# Patient Record
Sex: Male | Born: 1970
Health system: Southern US, Community
[De-identification: ages and names within clinical notes are randomized; demographics above are authoritative.]

## PROBLEM LIST (undated history)

## (undated) DIAGNOSIS — Z6828 Body mass index (BMI) 28.0-28.9, adult: Secondary | ICD-10-CM

## (undated) DIAGNOSIS — L409 Psoriasis, unspecified: Secondary | ICD-10-CM

## (undated) HISTORY — PX: LEG SURGERY: SHX1003

## (undated) HISTORY — DX: Psoriasis, unspecified: L40.9

## (undated) HISTORY — PX: SHOULDER SURGERY: SHX246

## (undated) HISTORY — DX: Body mass index (BMI) 28.0-28.9, adult: Z68.28

---

## 1993-05-17 DIAGNOSIS — C801 Malignant (primary) neoplasm, unspecified: Secondary | ICD-10-CM

## 1993-05-17 HISTORY — PX: COLON SURGERY: SHX602

## 1993-05-17 HISTORY — DX: Malignant (primary) neoplasm, unspecified: C80.1

## 1998-05-27 ENCOUNTER — Ambulatory Visit (HOSPITAL_COMMUNITY): Admission: RE | Admit: 1998-05-27 | Discharge: 1998-05-27 | Payer: Self-pay | Admitting: Gastroenterology

## 2000-10-03 ENCOUNTER — Ambulatory Visit (HOSPITAL_COMMUNITY): Admission: RE | Admit: 2000-10-03 | Discharge: 2000-10-03 | Payer: Self-pay | Admitting: Gastroenterology

## 2000-10-03 ENCOUNTER — Encounter (INDEPENDENT_AMBULATORY_CARE_PROVIDER_SITE_OTHER): Payer: Self-pay | Admitting: Specialist

## 2002-04-23 ENCOUNTER — Ambulatory Visit (HOSPITAL_COMMUNITY): Admission: RE | Admit: 2002-04-23 | Discharge: 2002-04-23 | Payer: Self-pay | Admitting: Gastroenterology

## 2005-04-15 ENCOUNTER — Ambulatory Visit: Payer: Self-pay | Admitting: Hematology & Oncology

## 2005-10-09 ENCOUNTER — Ambulatory Visit: Payer: Self-pay | Admitting: Hematology & Oncology

## 2005-10-13 LAB — CBC WITH DIFFERENTIAL/PLATELET
Basophils Absolute: 0 10*3/uL (ref 0.0–0.1)
Eosinophils Absolute: 0.1 10*3/uL (ref 0.0–0.5)
HCT: 43.4 % (ref 38.7–49.9)
HGB: 15.3 g/dL (ref 13.0–17.1)
LYMPH%: 31.3 % (ref 14.0–48.0)
MONO#: 0.4 10*3/uL (ref 0.1–0.9)
NEUT#: 2.2 10*3/uL (ref 1.5–6.5)
Platelets: 226 10*3/uL (ref 145–400)
RBC: 4.84 10*6/uL (ref 4.20–5.71)
WBC: 3.9 10*3/uL — ABNORMAL LOW (ref 4.0–10.0)

## 2005-10-13 LAB — CEA: CEA: 2 ng/mL (ref 0.0–5.0)

## 2005-10-13 LAB — COMPREHENSIVE METABOLIC PANEL
ALT: 29 U/L (ref 0–40)
CO2: 26 mEq/L (ref 19–32)
Calcium: 9.2 mg/dL (ref 8.4–10.5)
Chloride: 104 mEq/L (ref 96–112)
Creatinine, Ser: 1.2 mg/dL (ref 0.4–1.5)
Total Protein: 7.3 g/dL (ref 6.0–8.3)

## 2006-10-07 ENCOUNTER — Ambulatory Visit: Payer: Self-pay | Admitting: Hematology & Oncology

## 2007-09-17 ENCOUNTER — Inpatient Hospital Stay (HOSPITAL_COMMUNITY): Admission: EM | Admit: 2007-09-17 | Discharge: 2007-09-19 | Payer: Self-pay | Admitting: Emergency Medicine

## 2007-12-18 ENCOUNTER — Ambulatory Visit (HOSPITAL_COMMUNITY): Admission: RE | Admit: 2007-12-18 | Discharge: 2007-12-18 | Payer: Self-pay | Admitting: Orthopedic Surgery

## 2008-12-19 ENCOUNTER — Ambulatory Visit: Payer: Self-pay | Admitting: Hematology & Oncology

## 2008-12-23 LAB — CBC WITH DIFFERENTIAL (CANCER CENTER ONLY)
BASO%: 0.9 % (ref 0.0–2.0)
HCT: 44.1 % (ref 38.7–49.9)
LYMPH#: 1.6 10*3/uL (ref 0.9–3.3)
MONO#: 0.3 10*3/uL (ref 0.1–0.9)
Platelets: 190 10*3/uL (ref 145–400)
RDW: 11.1 % (ref 10.5–14.6)
WBC: 3.8 10*3/uL — ABNORMAL LOW (ref 4.0–10.0)

## 2008-12-23 LAB — COMPREHENSIVE METABOLIC PANEL
ALT: 22 U/L (ref 0–53)
AST: 21 U/L (ref 0–37)
Albumin: 4.4 g/dL (ref 3.5–5.2)
CO2: 27 mEq/L (ref 19–32)
Calcium: 9.5 mg/dL (ref 8.4–10.5)
Chloride: 102 mEq/L (ref 96–112)
Potassium: 4.4 mEq/L (ref 3.5–5.3)
Sodium: 137 mEq/L (ref 135–145)
Total Protein: 7.2 g/dL (ref 6.0–8.3)

## 2008-12-23 LAB — LIPID PANEL: Total CHOL/HDL Ratio: 3.4 Ratio

## 2009-01-22 IMAGING — CR DG FOOT COMPLETE 3+V*L*
3 series · 3 of 3 positions shown · non-contrast
Comparison: None

CLINICAL DATA: Fell

LEFT FOOT - COMPLETE 3+ VIEW

[view not recorded (1 of 3)]
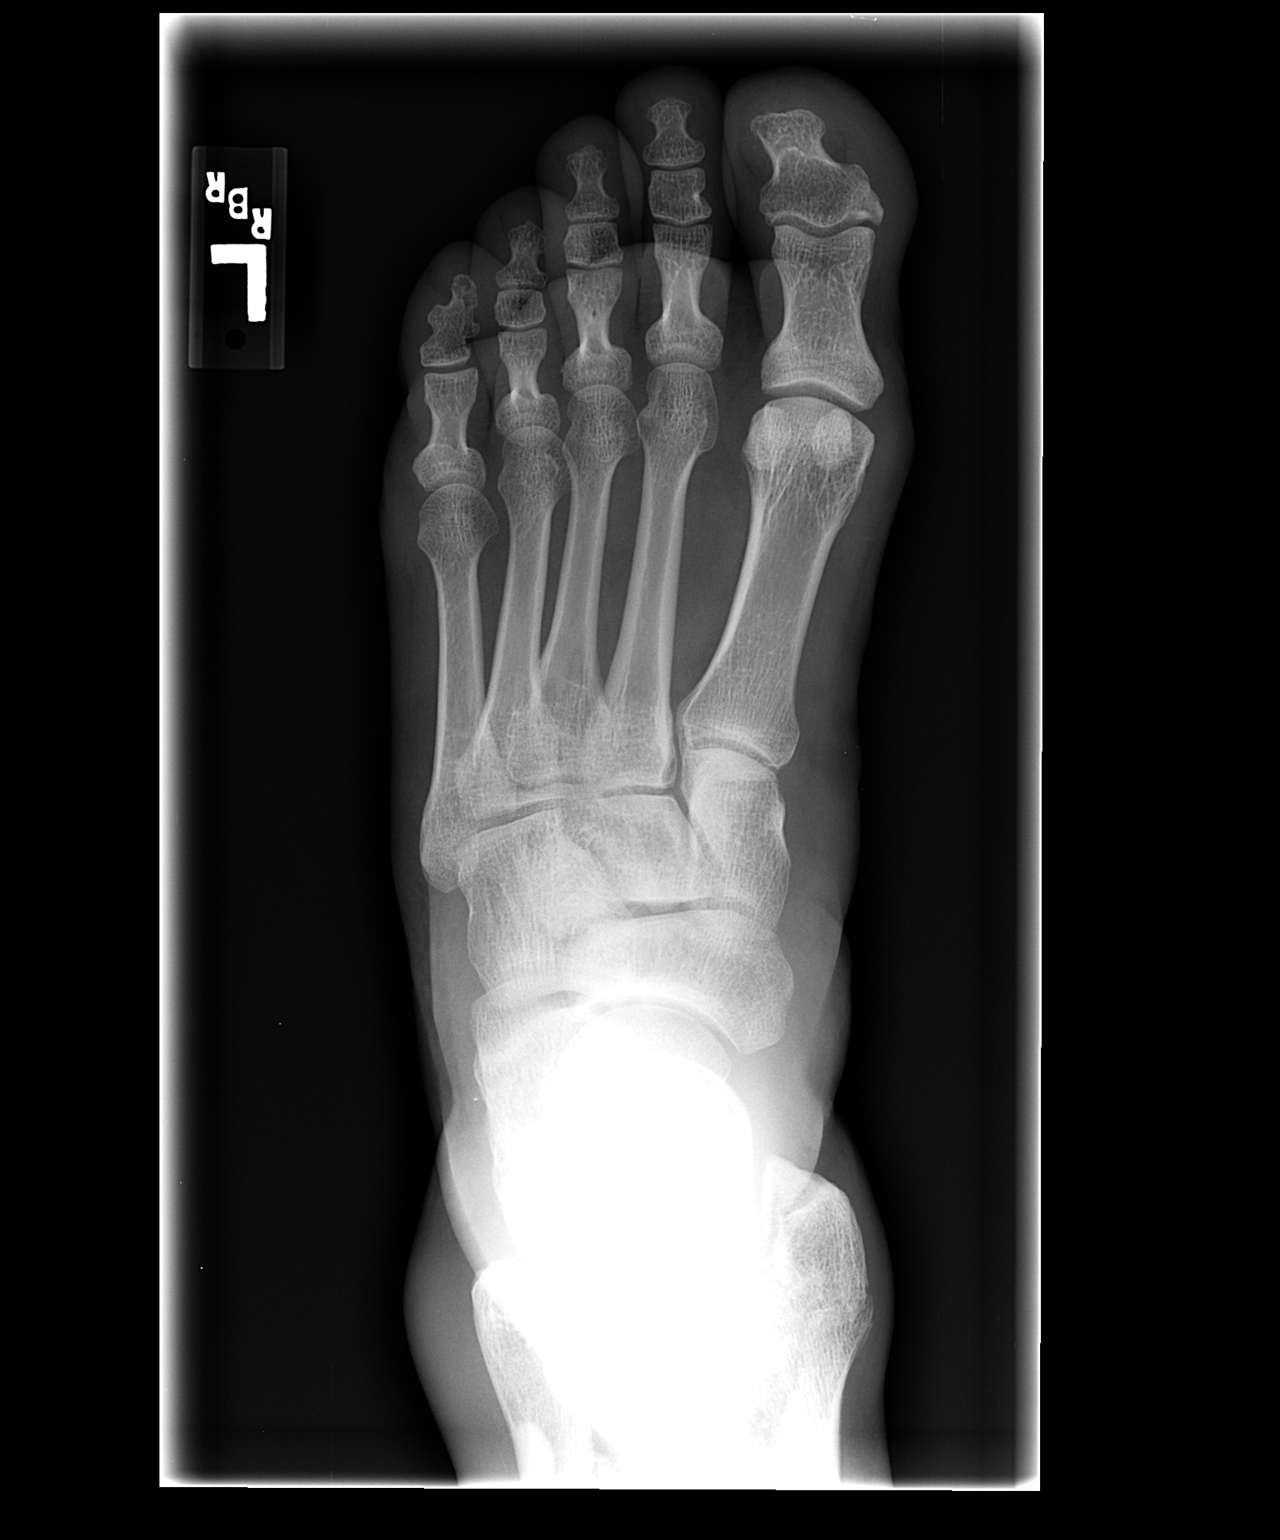

[view not recorded (2 of 3)]
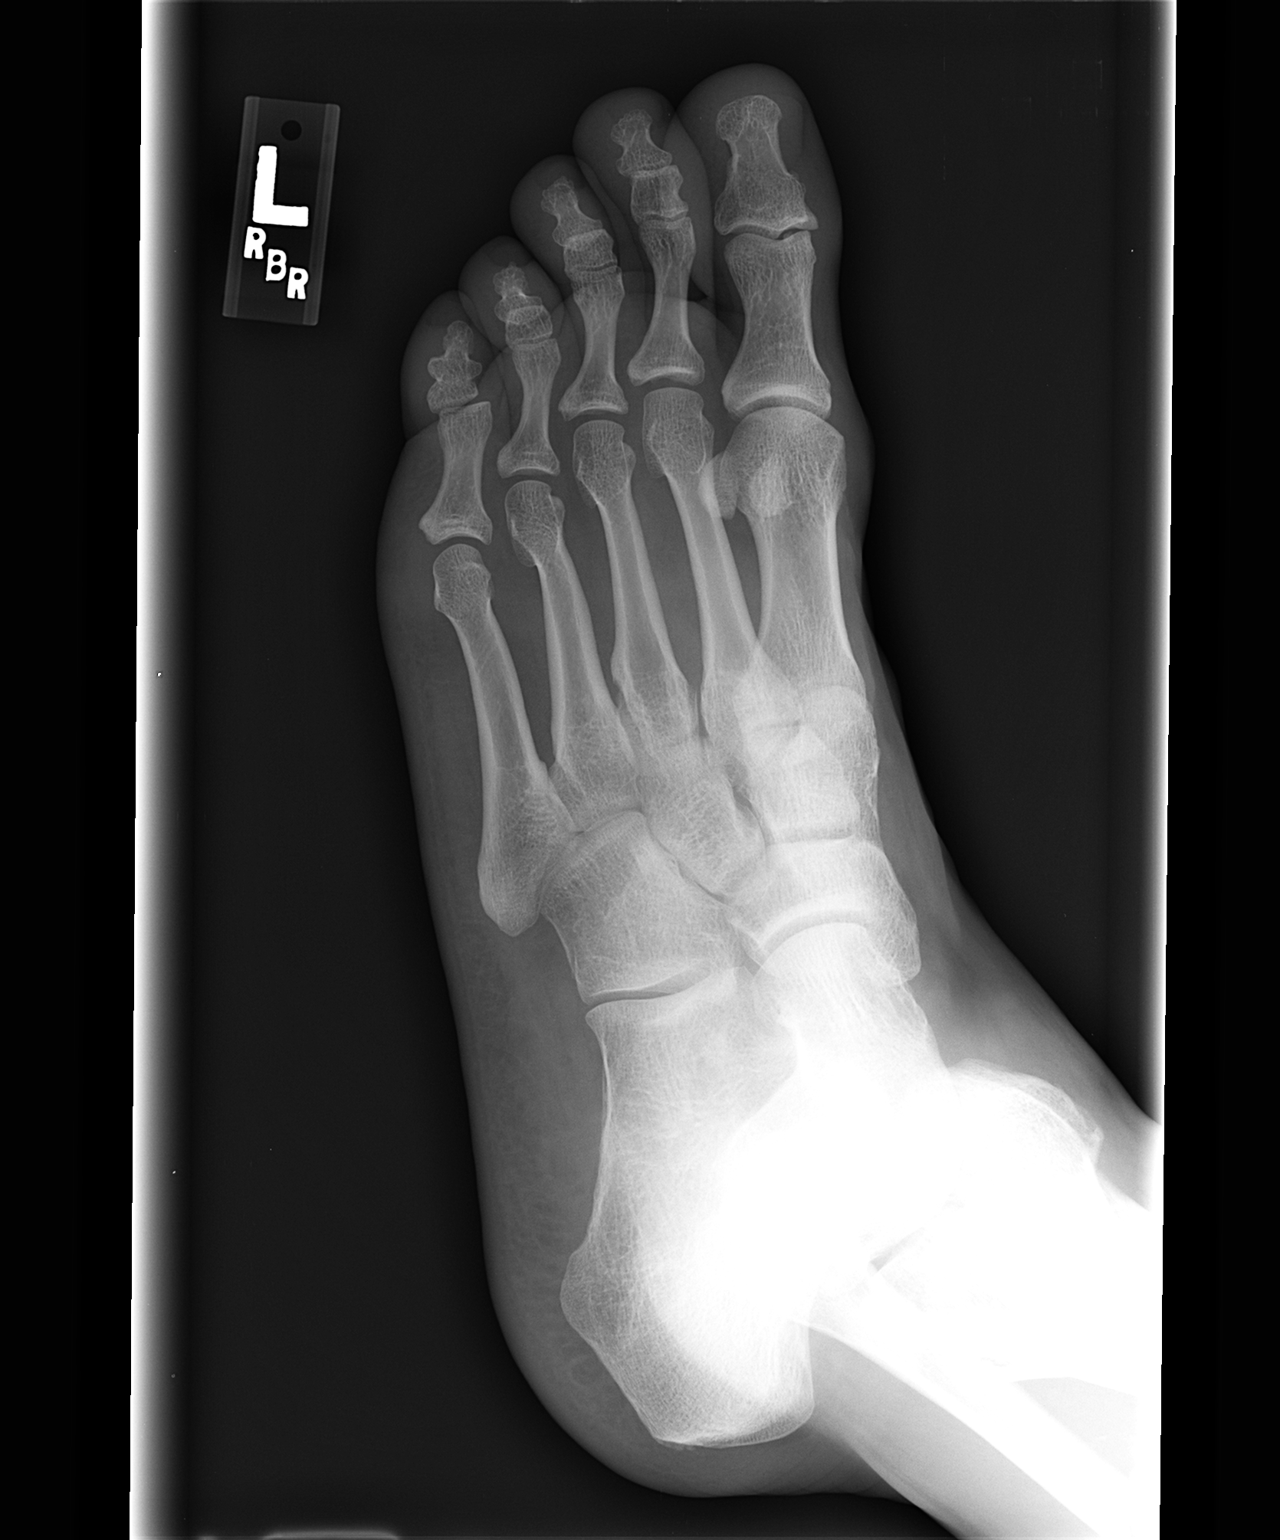

[view not recorded (3 of 3)]
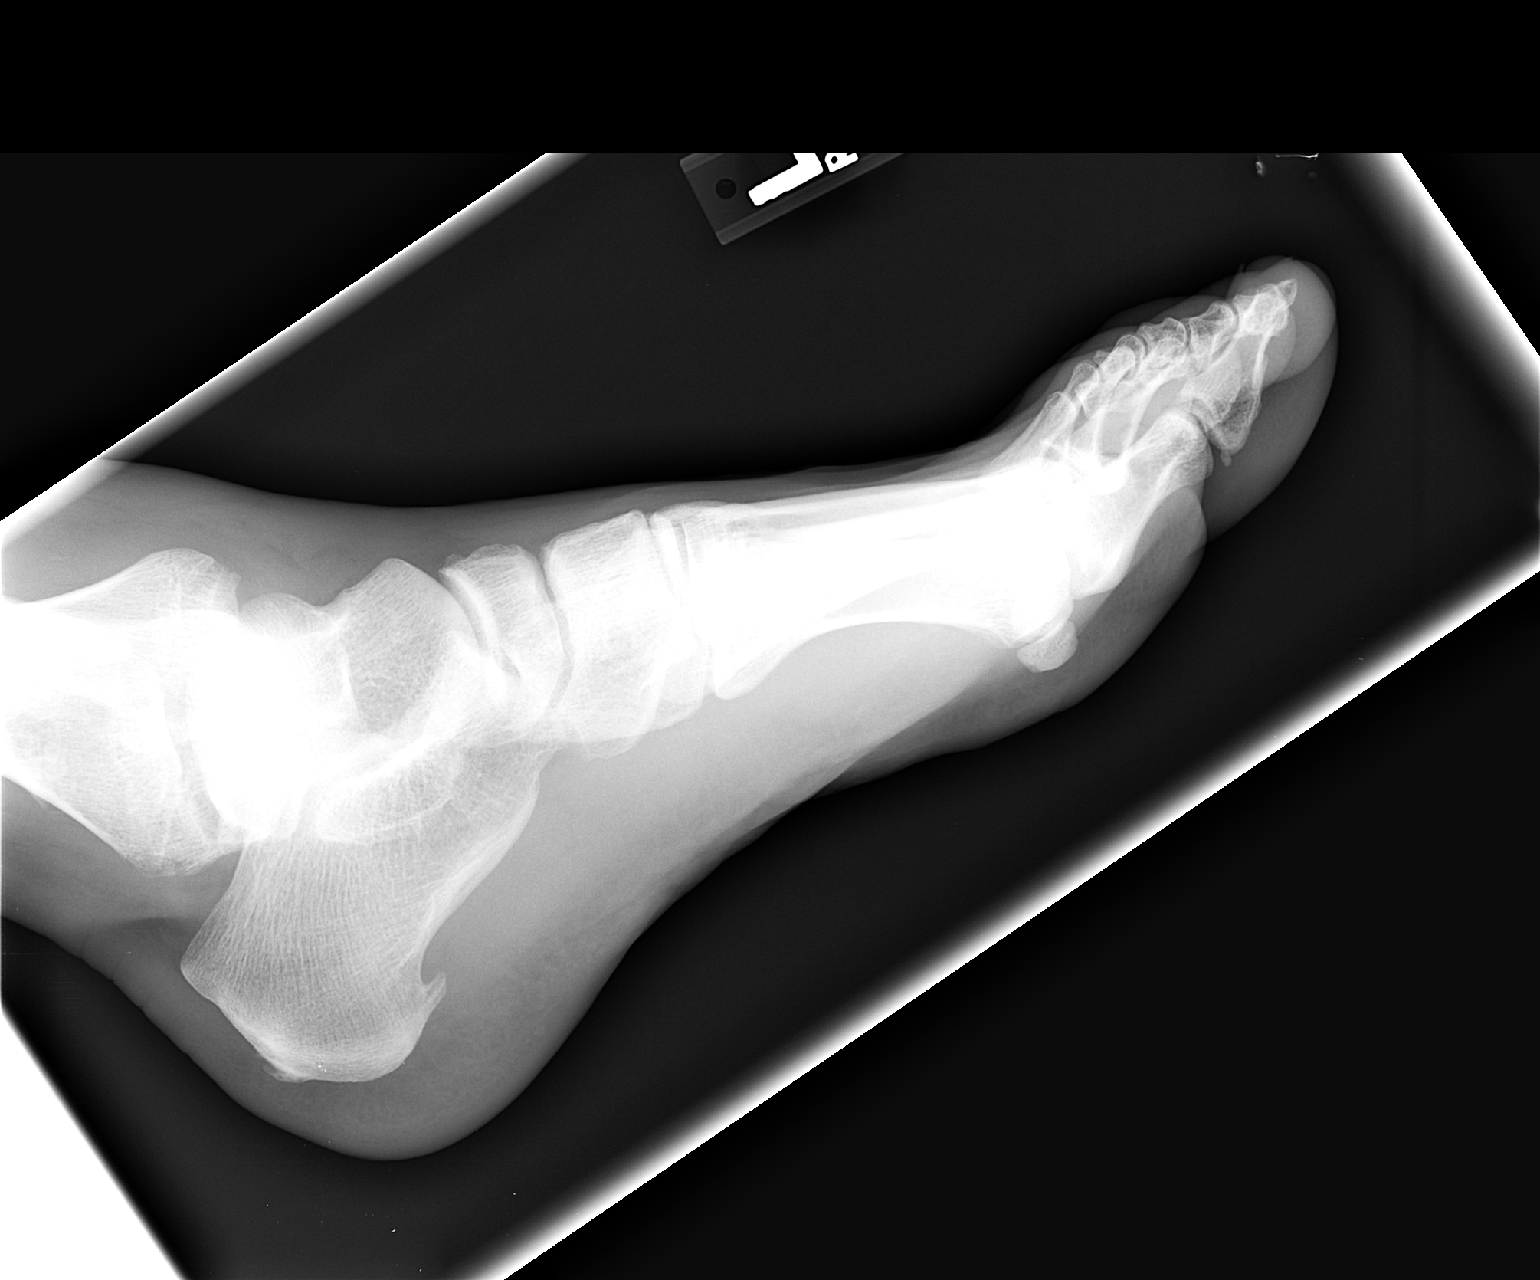

[3 of 3 positions shown; findings below may reference images not displayed]

FINDINGS: The bony structures of the foot are intact.  Joint spaces
are maintained.  The ankle fractures are noted.  Small calcaneal
heel spurs are noted.  There is a benign appearing enostosis
involving the distal fibula.
IMPRESSION: 1.  No foot fractures.
2.  Ankle fractures are again demonstrated.

## 2009-06-24 ENCOUNTER — Ambulatory Visit: Payer: Self-pay | Admitting: Hematology & Oncology

## 2009-07-02 LAB — COMPREHENSIVE METABOLIC PANEL
ALT: 23 U/L (ref 0–53)
Albumin: 4.6 g/dL (ref 3.5–5.2)
Alkaline Phosphatase: 47 U/L (ref 39–117)
Potassium: 4.5 mEq/L (ref 3.5–5.3)
Sodium: 140 mEq/L (ref 135–145)
Total Bilirubin: 0.7 mg/dL (ref 0.3–1.2)
Total Protein: 7.3 g/dL (ref 6.0–8.3)

## 2009-07-02 LAB — CBC WITH DIFFERENTIAL (CANCER CENTER ONLY)
BASO%: 0.4 % (ref 0.0–2.0)
Eosinophils Absolute: 0.1 10*3/uL (ref 0.0–0.5)
LYMPH%: 47.1 % (ref 14.0–48.0)
MCV: 92 fL (ref 82–98)
MONO#: 0.2 10*3/uL (ref 0.1–0.9)
MONO%: 6.7 % (ref 0.0–13.0)
NEUT#: 1.4 10*3/uL — ABNORMAL LOW (ref 1.5–6.5)
Platelets: 199 10*3/uL (ref 145–400)
RBC: 4.95 10*6/uL (ref 4.20–5.70)
RDW: 11.1 % (ref 10.5–14.6)
WBC: 3.2 10*3/uL — ABNORMAL LOW (ref 4.0–10.0)

## 2009-07-02 LAB — LIPID PANEL
LDL Cholesterol: 108 mg/dL — ABNORMAL HIGH (ref 0–99)
VLDL: 31 mg/dL (ref 0–40)

## 2009-12-29 ENCOUNTER — Ambulatory Visit: Payer: Self-pay | Admitting: Hematology & Oncology

## 2010-09-29 NOTE — Op Note (Signed)
NAME:  Mario Bradley, Mario Bradley            ACCOUNT NO.:  [id number]   MEDICAL RECORD NO.:  [id number]          PATIENT TYPE:  INP   LOCATION:  1831                         FACILITY:  [facility]   PHYSICIAN:  Marlowe Kays, M.D.  DATE OF BIRTH:  February 10, 1971   DATE OF PROCEDURE:  09/17/2007  DATE OF DISCHARGE:                               OPERATIVE REPORT   PREOPERATIVE DIAGNOSIS:  Closed displaced distal tibia and fibular  fractures, left ankle.   POSTOPERATIVE DIAGNOSIS:  Pilon-type fracture, distal left ankle.   OPERATION:  ORIF of pilon-type fracture, left ankle utilizing a 6-hole  fibular plate, 2 anterior-to-posterior screws in distal tibia to restore  the ankle joint, and a syndesmosis screw to restore the mortise.   SURGEON:  Marlowe Kays, MD   ASSISTANT:  Jamelle Rushing, [facility]   ANESTHESIA:  General.   JUSTIFICATION FOR PROCEDURE:  He had a vertical loading force type  injury with a fracture of the distal tibial diaphysis with shortening  and a displaced fracture of distal tibia which on attempted closed  reduction using a C-arm turned out to be very comminuted distal tibial  fracture with a split down the middle of the ankle joint on the sagittal  projection.   PROCEDURE IN DETAIL:  Prophylactic antibiotic, satisfactory general  anesthesia, time-out performed, pneumatic tourniquet applied, and the  left leg Esmarched out nonsterilely.  After the attempted closed  reduction which also allowed [place] to have a better visualization of the  fracture was performed, we then prepped the left leg with DuraPrep from  knee to toes and draped the sterile field.  Using a C-arm, I first  located the site of the distal fibular fracture and made incision there  protecting the superficial peroneal nerve and went down to the fracture  site which we anatomically reduced and then held while I placed a 6-hole  semitubular plate.  This reachieved the fibular length and helped  further at the reduction  of the ankle joint.  Despite manipulation of  the ankle joint at this point, we were unable to get a good reduction  and consequently I made an anterior midline incision and protecting the  neurovascular structures went down to the tibia and placed 2 cannulated  screw guide pins in satisfactory position on both AP and lateral  projections.  I then overdrilled these and measured them using a 4-0  cancellous type screw distally and found that I needed a fully threaded  4.5 cortical screw on the proximal hole to really bring down the ankle  joint and restore the plafond on the lateral projection.  There was also  a little widening of the medial mortise and through this site made a  separate lateral incision using the C-arm for guidance and was able to  thread a drill bit between the 2 anterior screws and followed this with  a 4.5 cortical screw restoring the ankle mortise.  The badly comminuted  tibial fracture included the medial malleolus and the metaphyseal and  diaphyseal areas but everything was in good position and seemed  reasonably stable and I saw no need for  any internal fixation which  might even have been contraindicated because of the comminution.  The  wound was then well irrigated with sterile saline.  The anterior ankle  wound was closed with 2-0 Vicryl in subcutaneous tissue and interrupted  3-0 nylon mattress sutures in the skin.  The same was true for the  distal fibular incision and more proximally the muscle and fascia were  closed with interrupted 0 Vicryl, 2-0 Vicryl superficially, and again 3-  0 nylon on the skin.  Dry sterile dressings were  applied.  Tourniquet was released.  The foot pinked up nicely.  A well-  padded short-leg splint cast was applied.  He tolerated the procedure  well and was taken to the recovery room in satisfactory condition with  no known complications.           ______________________________  Marlowe Kays, M.D.     JA/MEDQ  D:   09/17/2007  T:  09/18/2007  Job:  [id number]

## 2010-09-29 NOTE — Op Note (Signed)
NAME:  Mario Bradley, Mario Bradley            ACCOUNT NO.:  [id number]   MEDICAL RECORD NO.:  [id number]          PATIENT TYPE:  AMB   LOCATION:  DAY                          FACILITY:  [facility]   PHYSICIAN:  Marlowe Kays, M.D.  DATE OF BIRTH:  Jun 04, 1970   DATE OF PROCEDURE:  12/18/2007  DATE OF DISCHARGE:                               OPERATIVE REPORT   PREOPERATIVE DIAGNOSIS:  Retained syndesmosis screw, left ankle, status  post open reduction and internal fixation of complex pilon fracture.   POSTOPERATIVE DIAGNOSIS:  Retained syndesmosis screw, left ankle, status  post open reduction and internal fixation of complex pilon fracture.   OPERATION:  Removal of syndesmosis screw, lateral left ankle.   SURGEON:  Marlowe Kays, M.D.   ASSISTANT:  Nurse.   ANESTHESIA:  General.   PATHOLOGY AND JUSTIFICATION FOR PROCEDURE:  Roughly 13 weeks ago we had  performed ORIF of the complex pilon fracture and part of this was the  placement of a syndesmosis screw.  He has been careful not to weightbear  and he is here at this time for removal of the syndesmosis screw to  begin weightbearing.   PROCEDURE:  Satisfactory general anesthesia, pneumatic tourniquet, left  lower extremity.  The leg was esmarched out nonsterilely and tourniquet  inflated to 300 mmHg, the left foot and ankle prepped with DuraPrep,  draped in a sterile field.  Time-out performed.  Using the mini C-arm, I  was able localize the screw at the appropriate level of the distal  fibula.  I made my incision through the old surgical incision and was  not immediately able to find the screw.  I then used the C-arm to look  for the position of the screw on the lateral plane and found it  inferiorly.  The screw was found, removed, and it was saved for the  patient.  The wound was irrigated with sterile saline and the  subcutaneous tissues all infiltrated with 0.5% plain Marcaine.  I then  closed the wound with interrupted 2-0 Vicryl in  the fascial-type layer  and subcutaneous layers and interrupted 4-0 nylon mattress sutures in  the skin.  Betadine and Adaptic dry sterile dressing were applied.  He  tolerated the procedure well and was on his way to recovery in  satisfactory condition at this time with no known complications.           ______________________________  Marlowe Kays, M.D.     JA/MEDQ  D:  12/18/2007  T:  12/18/2007  Job:  [id number]

## 2010-10-02 NOTE — Procedures (Signed)
[place]  Patient:    Mario Bradley, Mario Bradley                   MRN: [id number] Proc. Date: 10/03/00 Adm. Date:  [id number] Attending:  Rich Brave CC:         Rose Phi. Myna Hidalgo, M.D.  Delaney Meigs, M.D.   Procedure Report  PROCEDURE PERFORMED:  Colonoscopy with biopsies.  INDICATION:  A 40 year old who is now about six years status post resection of a proximal colonic cancer.  He has done well in the interim.  His most surveillance examination, two years ago, was negative.  FINDINGS:  Small ulcerations right at the ileocolonic anastomosis without involvement of the more proximal portions of the terminal ileum, probable related to aspirin exposure.  Diminutive early polyp at 20 cm biopsied.  DESCRIPTION OF PROCEDURE:  The nature, purpose and risks of the procedure were familiar to the patient, who provided written consent.  Sedation was Fentanyl 50 mcg and Versed 7 mg IV without arrhythmias or desaturation.  Digital exam of the prostate was normal.  The Olympus adult video colonoscope was easily advanced to the ileocolonic anastomosis and for a moderate distance into a normal appearing terminal ileum.  However, right at the ileocolonic anastomosis there were several small (3 to 5 mm) irregularly bordered, slightly deep ulcerations.  Ulcerations were not observed elsewhere, either in the TI or in the colon.  There was a diminutive early polyp at 20 cm removed by a single cold biopsy.  No large polyps or evidence of recurrent cancer were observed.  There was no evidence of colitis, vascular malformations or diverticulosis.  Retroflexion could not be accomplished in the rectum due to the small rectal ampulla.  The patient tolerated this procedure well and there were no apparent complications.  IMPRESSION:  Findings as described above.  Small polyp and focal ulcerations which I suspect are due to aspirin exposure.  PLAN:   Await pathology. DD:  10/03/00 TD:  10/03/00 Job: [id number]

## 2011-02-10 LAB — CBC
HCT: 46
MCV: 92.7
RBC: 4.96
WBC: 9.7

## 2011-02-10 LAB — BASIC METABOLIC PANEL
Chloride: 102
GFR calc Af Amer: >60
Potassium: 3.9
Sodium: 139

## 2011-02-10 LAB — HEPATIC FUNCTION PANEL
ALT: 36
Albumin: 4
Alkaline Phosphatase: 44
Total Protein: 7.6

## 2011-02-10 LAB — PROTIME-INR: Prothrombin Time: 14.1

## 2014-10-18 ENCOUNTER — Other Ambulatory Visit: Payer: Self-pay | Admitting: Physician Assistant

## 2019-05-30 DIAGNOSIS — L409 Psoriasis, unspecified: Secondary | ICD-10-CM | POA: Diagnosis not present

## 2019-08-13 DIAGNOSIS — Z1159 Encounter for screening for other viral diseases: Secondary | ICD-10-CM | POA: Diagnosis not present

## 2019-08-16 DIAGNOSIS — Z980 Intestinal bypass and anastomosis status: Secondary | ICD-10-CM | POA: Diagnosis not present

## 2019-08-16 DIAGNOSIS — Z85038 Personal history of other malignant neoplasm of large intestine: Secondary | ICD-10-CM | POA: Diagnosis not present

## 2019-08-16 DIAGNOSIS — D123 Benign neoplasm of transverse colon: Secondary | ICD-10-CM | POA: Diagnosis not present

## 2019-10-03 DIAGNOSIS — M9905 Segmental and somatic dysfunction of pelvic region: Secondary | ICD-10-CM | POA: Diagnosis not present

## 2019-10-03 DIAGNOSIS — M62830 Muscle spasm of back: Secondary | ICD-10-CM | POA: Diagnosis not present

## 2019-10-03 DIAGNOSIS — M9903 Segmental and somatic dysfunction of lumbar region: Secondary | ICD-10-CM | POA: Diagnosis not present

## 2019-10-03 DIAGNOSIS — M9904 Segmental and somatic dysfunction of sacral region: Secondary | ICD-10-CM | POA: Diagnosis not present

## 2019-10-04 DIAGNOSIS — M9904 Segmental and somatic dysfunction of sacral region: Secondary | ICD-10-CM | POA: Diagnosis not present

## 2019-10-04 DIAGNOSIS — M9903 Segmental and somatic dysfunction of lumbar region: Secondary | ICD-10-CM | POA: Diagnosis not present

## 2019-10-04 DIAGNOSIS — M9905 Segmental and somatic dysfunction of pelvic region: Secondary | ICD-10-CM | POA: Diagnosis not present

## 2019-10-04 DIAGNOSIS — M62830 Muscle spasm of back: Secondary | ICD-10-CM | POA: Diagnosis not present

## 2019-10-08 DIAGNOSIS — M9905 Segmental and somatic dysfunction of pelvic region: Secondary | ICD-10-CM | POA: Diagnosis not present

## 2019-10-08 DIAGNOSIS — M9903 Segmental and somatic dysfunction of lumbar region: Secondary | ICD-10-CM | POA: Diagnosis not present

## 2019-10-08 DIAGNOSIS — M9904 Segmental and somatic dysfunction of sacral region: Secondary | ICD-10-CM | POA: Diagnosis not present

## 2019-10-08 DIAGNOSIS — M62830 Muscle spasm of back: Secondary | ICD-10-CM | POA: Diagnosis not present

## 2019-10-11 DIAGNOSIS — M9903 Segmental and somatic dysfunction of lumbar region: Secondary | ICD-10-CM | POA: Diagnosis not present

## 2019-10-11 DIAGNOSIS — M9905 Segmental and somatic dysfunction of pelvic region: Secondary | ICD-10-CM | POA: Diagnosis not present

## 2019-10-11 DIAGNOSIS — M62830 Muscle spasm of back: Secondary | ICD-10-CM | POA: Diagnosis not present

## 2019-10-11 DIAGNOSIS — M9904 Segmental and somatic dysfunction of sacral region: Secondary | ICD-10-CM | POA: Diagnosis not present

## 2019-10-18 DIAGNOSIS — M9904 Segmental and somatic dysfunction of sacral region: Secondary | ICD-10-CM | POA: Diagnosis not present

## 2019-10-18 DIAGNOSIS — M9903 Segmental and somatic dysfunction of lumbar region: Secondary | ICD-10-CM | POA: Diagnosis not present

## 2019-10-18 DIAGNOSIS — M9905 Segmental and somatic dysfunction of pelvic region: Secondary | ICD-10-CM | POA: Diagnosis not present

## 2019-10-18 DIAGNOSIS — M62830 Muscle spasm of back: Secondary | ICD-10-CM | POA: Diagnosis not present

## 2019-10-30 DIAGNOSIS — M62830 Muscle spasm of back: Secondary | ICD-10-CM | POA: Diagnosis not present

## 2019-10-30 DIAGNOSIS — M9903 Segmental and somatic dysfunction of lumbar region: Secondary | ICD-10-CM | POA: Diagnosis not present

## 2019-10-30 DIAGNOSIS — M9905 Segmental and somatic dysfunction of pelvic region: Secondary | ICD-10-CM | POA: Diagnosis not present

## 2019-10-30 DIAGNOSIS — M9904 Segmental and somatic dysfunction of sacral region: Secondary | ICD-10-CM | POA: Diagnosis not present

## 2019-11-06 DIAGNOSIS — M62830 Muscle spasm of back: Secondary | ICD-10-CM | POA: Diagnosis not present

## 2019-11-06 DIAGNOSIS — M9903 Segmental and somatic dysfunction of lumbar region: Secondary | ICD-10-CM | POA: Diagnosis not present

## 2019-11-06 DIAGNOSIS — M9905 Segmental and somatic dysfunction of pelvic region: Secondary | ICD-10-CM | POA: Diagnosis not present

## 2019-11-06 DIAGNOSIS — M9904 Segmental and somatic dysfunction of sacral region: Secondary | ICD-10-CM | POA: Diagnosis not present

## 2019-11-20 DIAGNOSIS — M62830 Muscle spasm of back: Secondary | ICD-10-CM | POA: Diagnosis not present

## 2019-11-20 DIAGNOSIS — M9903 Segmental and somatic dysfunction of lumbar region: Secondary | ICD-10-CM | POA: Diagnosis not present

## 2019-11-20 DIAGNOSIS — M9904 Segmental and somatic dysfunction of sacral region: Secondary | ICD-10-CM | POA: Diagnosis not present

## 2019-11-20 DIAGNOSIS — M9905 Segmental and somatic dysfunction of pelvic region: Secondary | ICD-10-CM | POA: Diagnosis not present

## 2020-01-07 ENCOUNTER — Telehealth: Payer: Self-pay | Admitting: Physician Assistant

## 2020-01-07 NOTE — Telephone Encounter (Signed)
Patient calling because [facility] is still unable to fill prescription for [facility]  because our office needs to call [employer] for PA and "PLA" also wants to know if he can have sample for this week.

## 2020-01-07 NOTE — Telephone Encounter (Signed)
Phone call from patient saying that [facility] needed a PA for his stelara before they could ship it. I spoke to [facility] and they told me that all they need is for patient to call and confirm the shipping. I told the patient that he will call and do that. He will call back with any issues.

## 2020-05-02 ENCOUNTER — Encounter: Payer: Self-pay | Admitting: Physician Assistant

## 2020-05-02 ENCOUNTER — Other Ambulatory Visit: Payer: Self-pay | Admitting: Physician Assistant

## 2020-05-02 ENCOUNTER — Telehealth: Payer: Self-pay | Admitting: Physician Assistant

## 2020-05-02 DIAGNOSIS — U071 COVID-19: Secondary | ICD-10-CM

## 2020-05-02 DIAGNOSIS — Z6828 Body mass index (BMI) 28.0-28.9, adult: Secondary | ICD-10-CM

## 2020-05-02 NOTE — Progress Notes (Signed)
I connected by phone with Mario Bradley on 05/02/2020 at 9:48 AM to discuss the potential use of a new treatment for mild to moderate COVID-19 viral infection in non-hospitalized patients.  This patient is a 49 y.o. male that meets the FDA criteria for Emergency Use Authorization of COVID monoclonal antibody sotrovimab, casirivimab/imdevimab or bamlamivimab/estevimab.  Has a (+) direct SARS-CoV-2 viral test result  Has mild or moderate COVID-19   Is NOT hospitalized due to COVID-19  Is within 10 days of symptom onset  Has at least one of the high risk factor(s) for progression to severe COVID-19 and/or hospitalization as defined in EUA.  Specific high risk criteria : BMI > 25   I have spoken and communicated the following to the patient or parent/caregiver regarding COVID monoclonal antibody treatment:  1. FDA has authorized the emergency use for the treatment of mild to moderate COVID-19 in adults and pediatric patients with positive results of direct SARS-CoV-2 viral testing who are 46 years of age and older weighing at least 40 kg, and who are at high risk for progressing to severe COVID-19 and/or hospitalization.  2. The significant known and potential risks and benefits of COVID monoclonal antibody, and the extent to which such potential risks and benefits are unknown.  3. Information on available alternative treatments and the risks and benefits of those alternatives, including clinical trials.  4. Patients treated with COVID monoclonal antibody should continue to self-isolate and use infection control measures (e.g., wear mask, isolate, social distance, avoid sharing personal items, clean and disinfect "high touch" surfaces, and frequent handwashing) according to CDC guidelines.   5. The patient or parent/caregiver has the option to accept or refuse COVID monoclonal antibody treatment.  After reviewing this information with the patient, the patient has agreed to receive one  of the available covid 19 monoclonal antibodies and will be provided an appropriate fact sheet prior to infusion.  Sx onset 12/9. Set up for infusion on 12/18@ 10:30am. Directions given to [facility]. Pt is aware that insurance will be charged an infusion fee. Pt is unvaccinated. + home test that he threw away.   Angelena Form 05/02/2020 9:48 AM

## 2020-05-02 NOTE — Telephone Encounter (Signed)
Called to discuss with patient about Covid symptoms and the use of sotrovimab, bamlanivimab/etesevimab or casirivimab/imdevimab, a monoclonal antibody infusion for those with mild to moderate Covid symptoms and at a high risk of hospitalization.  Pt is qualified for this infusion at the [place] infusion center due to; Specific high risk criteria : needs to be determined.    Message left to call back our hotline [phone]. My chart message sent if active on Mychart.   Angelena Form PA-C

## 2020-05-03 ENCOUNTER — Ambulatory Visit (HOSPITAL_COMMUNITY)
Admission: RE | Admit: 2020-05-03 | Discharge: 2020-05-03 | Disposition: A | Discharge status: Home / Self Care | Payer: BC Managed Care – PPO | Source: Ambulatory Visit | Attending: Pulmonary Disease | Admitting: Pulmonary Disease

## 2020-05-03 DIAGNOSIS — Z6828 Body mass index (BMI) 28.0-28.9, adult: Secondary | ICD-10-CM | POA: Diagnosis not present

## 2020-05-03 DIAGNOSIS — U071 COVID-19: Secondary | ICD-10-CM | POA: Diagnosis not present

## 2020-05-03 MED ORDER — ALBUTEROL SULFATE HFA 108 (90 BASE) MCG/ACT IN AERS: 2.0000 | INHALATION_SPRAY | Freq: Once | RESPIRATORY_TRACT | Status: DC | PRN

## 2020-05-03 MED ORDER — SODIUM CHLORIDE 0.9 % IV SOLN: INTRAVENOUS | Status: DC | PRN

## 2020-05-03 MED ORDER — FAMOTIDINE IN NACL 20-0.9 MG/50ML-% IV SOLN: 20.0000 mg | Freq: Once | INTRAVENOUS | Status: DC | PRN

## 2020-05-03 MED ORDER — EPINEPHRINE 0.3 MG/0.3ML IJ SOAJ: 0.3000 mg | Freq: Once | INTRAMUSCULAR | Status: DC | PRN

## 2020-05-03 MED ORDER — SODIUM CHLORIDE 0.9 % IV SOLN: Freq: Once | INTRAVENOUS | Status: AC

## 2020-05-03 MED ORDER — DIPHENHYDRAMINE HCL 50 MG/ML IJ SOLN: 50.0000 mg | Freq: Once | INTRAMUSCULAR | Status: DC | PRN

## 2020-05-03 MED ORDER — METHYLPREDNISOLONE SODIUM SUCC 125 MG IJ SOLR: 125.0000 mg | Freq: Once | INTRAMUSCULAR | Status: DC | PRN

## 2020-05-03 NOTE — Discharge Instructions (Signed)
10 Things You Can Do to Manage Your COVID-19 Symptoms at Home If you have possible or confirmed COVID-19: 1. Stay home from work and school. And stay away from other public places. If you must go out, avoid using any kind of public transportation, ridesharing, or taxis. 2. Monitor your symptoms carefully. If your symptoms get worse, call your healthcare provider immediately. 3. Get rest and stay hydrated. 4. If you have a medical appointment, call the healthcare provider ahead of time and tell them that you have or may have COVID-19. 5. For medical emergencies, call 911 and notify the dispatch personnel that you have or may have COVID-19. 6. Cover your cough and sneezes with a tissue or use the inside of your elbow. 7. Wash your hands often with soap and water for at least 20 seconds or clean your hands with an alcohol-based hand sanitizer that contains at least 60% alcohol. 8. As much as possible, stay in a specific room and away from other people in your home. Also, you should use a separate bathroom, if available. If you need to be around other people in or outside of the home, wear a mask. 9. Avoid sharing personal items with other people in your household, like dishes, towels, and bedding. 10. Clean all surfaces that are touched often, like counters, tabletops, and doorknobs. Use household cleaning sprays or wipes according to the label instructions. cdc.gov/coronavirus 11/15/2018 This information is not intended to replace advice given to you by your health care provider. Make sure you discuss any questions you have with your health care provider. Document Revised: 04/19/2019 Document Reviewed: 04/19/2019 Elsevier Patient Education  2020 Elsevier Inc. What types of side effects do monoclonal antibody drugs cause?  Common side effects  In general, the more common side effects caused by monoclonal antibody drugs include: . Allergic reactions, such as hives or itching . Flu-like signs and  symptoms, including chills, fatigue, fever, and muscle aches and pains . Nausea, vomiting . Diarrhea . Skin rashes . Low blood pressure   The CDC is recommending patients who receive monoclonal antibody treatments wait at least 90 days before being vaccinated.  Currently, there are no data on the safety and efficacy of mRNA COVID-19 vaccines in persons who received monoclonal antibodies or convalescent plasma as part of COVID-19 treatment. Based on the estimated half-life of such therapies as well as evidence suggesting that reinfection is uncommon in the 90 days after initial infection, vaccination should be deferred for at least 90 days, as a precautionary measure until additional information becomes available, to avoid interference of the antibody treatment with vaccine-induced immune responses. If you have any questions or concerns after the infusion please call the Advanced Practice Provider on call at [phone]. This number is ONLY intended for your use regarding questions or concerns about the infusion post-treatment side-effects.  Please do not provide this number to others for use. For return to work notes please contact your primary care provider.   If someone you know is interested in receiving treatment please have them call the COVID hotline at [phone].   

## 2020-05-03 NOTE — Progress Notes (Signed)
Patient reviewed Fact Sheet for Patients, Parents, and Caregivers for Emergency Use Authorization (EUA) of bamlanivimab and etesevimab for the Treatment of Coronavirus. Patient also reviewed and is agreeable to the estimated cost of treatment. Patient is agreeable to proceed.   

## 2020-05-03 NOTE — Progress Notes (Signed)
  Diagnosis: COVID-19  Physician: Dr. Asencion Noble  Procedure: [facility] Infusion [facility] Med: bamlanivimab\etesevimab infusion - Provided patient with bamlanimivab\etesevimab fact sheet for patients, parents and caregivers prior to infusion.  Complications: No immediate complications noted.  Discharge: Discharged home   Mario Bradley 05/03/2020

## 2020-07-02 ENCOUNTER — Telehealth: Payer: Self-pay

## 2020-07-02 NOTE — Telephone Encounter (Signed)
PATIENT OVER DUE FOR OV & TB FOR REFILLS

## 2020-07-14 ENCOUNTER — Telehealth: Payer: Self-pay | Admitting: Dermatology

## 2020-07-14 NOTE — Telephone Encounter (Signed)
Phone call to patient to inform him that he needs an appointment and a TB Gold before we can refill his medication.  Patient scheduled an appointment.

## 2020-07-14 NOTE — Telephone Encounter (Signed)
Patient gave his insurance information for [employer] and it was put into the system.

## 2020-07-17 ENCOUNTER — Encounter: Payer: Self-pay | Admitting: Dermatology

## 2020-07-17 ENCOUNTER — Ambulatory Visit: Payer: BC Managed Care – PPO | Admitting: Dermatology

## 2020-07-17 ENCOUNTER — Other Ambulatory Visit: Payer: Self-pay

## 2020-07-17 DIAGNOSIS — Z79899 Other long term (current) drug therapy: Secondary | ICD-10-CM | POA: Diagnosis not present

## 2020-07-17 DIAGNOSIS — L400 Psoriasis vulgaris: Secondary | ICD-10-CM | POA: Diagnosis not present

## 2020-07-17 DIAGNOSIS — L700 Acne vulgaris: Secondary | ICD-10-CM

## 2020-07-17 MED ORDER — AMZEEQ 4 % EX FOAM: 1.0000 "application " | Freq: Every day | CUTANEOUS | 3 refills | Status: DC

## 2020-07-19 LAB — QUANTIFERON-TB GOLD PLUS
Mitogen-NIL: >10 IU/mL
NIL: 0.05 IU/mL
QuantiFERON-TB Gold Plus: NEGATIVE
TB1-NIL: 0.01 IU/mL
TB2-NIL: 0.02 IU/mL

## 2020-07-21 ENCOUNTER — Telehealth: Payer: Self-pay | Admitting: *Deleted

## 2020-07-21 DIAGNOSIS — L400 Psoriasis vulgaris: Secondary | ICD-10-CM

## 2020-07-21 MED ORDER — STELARA 45 MG/0.5ML ~~LOC~~ SOSY: 45.0000 mg | PREFILLED_SYRINGE | Freq: Once | SUBCUTANEOUS | 4 refills | Status: AC

## 2020-07-21 NOTE — Telephone Encounter (Signed)
Received negative TB results so okay to fill Rx Stelera per Lavonna Monarch.  Sent refill to [facility]

## 2020-07-24 ENCOUNTER — Telehealth: Payer: Self-pay | Admitting: *Deleted

## 2020-07-24 NOTE — Telephone Encounter (Signed)
Received [employer] form stating needs patients clnical notes/therapy faxed over recent office notes for approval of stelera to [facility] @ [phone]

## 2020-07-28 NOTE — Telephone Encounter (Signed)
Re-faxed office notes for 3/22 visit sendrra didn't receive them.

## 2020-07-29 ENCOUNTER — Encounter: Payer: Self-pay | Admitting: Dermatology

## 2020-07-29 ENCOUNTER — Telehealth: Payer: Self-pay | Admitting: Physician Assistant

## 2020-07-29 NOTE — Progress Notes (Signed)
   Follow-Up Visit   Subjective  DEEP BONAWITZ is a 50 y.o. male who presents for the following: Psoriasis (Patient clear on stelara).  Psoriasis Location: Was all over Duration:  Quality: Essentially clear Associated Signs/Symptoms: Modifying Factors:   Severity:  Timing: Context: Also would like to discuss breaking out on face.  Objective  Well appearing patient in no apparent distress; mood and affect are within normal limits. Objective  Mid Back: Mr. Meroney cutaneous psoriasis is generally clear on his Stelara.  He has no current joint symptoms.  Objectively there is minimal small plaque disease with no synovitis.  Objective  Head - Anterior (Face): Small inflammatory papules that may represent a combination of adult acne plus folliculitis    All skin waist up examined.   Assessment & Plan    Plaque psoriasis Mid Back  I discussed in some detail the fixed dosage and frequency of Stelara.  There is no data to suggest this predisposes him to serious Covid infection.  We will update his TB status and plan on continuing medication for the next year.  Knows he can call me with any question or issue.  Other Related Procedures QuantiFERON-TB Gold Plus  Encounter for long-term (current) use of medications  Other Related Procedures QuantiFERON-TB Gold Plus  Acne vulgaris Head - Anterior (Face)  If his out-of-pocket cost is acceptable, we will try topical minocycline (Amzeeq) nightly for 1 prescription and then I have asked him to contact me with a status update.  Minocycline HCl Micronized (AMZEEQ) 4 % FOAM - Head - Anterior (Face)      I, Lavonna Monarch, MD, have reviewed all documentation for this visit.  The documentation on 07/29/20 for the exam, diagnosis, procedures, and orders are all accurate and complete.

## 2020-07-29 NOTE — Telephone Encounter (Signed)
Patient aware pending faxed office notes today

## 2020-07-29 NOTE — Telephone Encounter (Signed)
Prior Authorization required for [employer].  Iantha Fallen reviewing patient file and will notify us if any other information is needed.

## 2020-07-29 NOTE — Telephone Encounter (Signed)
Wants to know status of Stelara refill

## 2020-07-30 ENCOUNTER — Telehealth: Payer: Self-pay | Admitting: *Deleted

## 2020-07-30 NOTE — Telephone Encounter (Signed)
Prior authorization done via cover my meds for Mario Bradley (Key: [id number])  Your information has been submitted to [name]. [name] will review the request and notify you of the determination decision directly, typically within 72 hours of receiving all information.  You will also receive your request decision electronically. To check for an update later, open this request again from your dashboard.  If [name] has not responded within the specified timeframe or if you have any questions about your PA submission, contact [name] directly at [phone].

## 2020-08-01 DIAGNOSIS — Z021 Encounter for pre-employment examination: Secondary | ICD-10-CM | POA: Diagnosis not present

## 2020-08-05 ENCOUNTER — Telehealth: Payer: Self-pay | Admitting: Physician Assistant

## 2020-08-05 NOTE — Telephone Encounter (Signed)
Left message saying he hasn't heard from [place] and hasn't gotten his Stelara. Wants to know if he needs to do anything/ what the status is

## 2020-08-05 NOTE — Telephone Encounter (Signed)
Informed patient that his insurance denied stelera and we are now in the appeal process. Patient wanted to know why his inusrance denied it I told patient that is something he would have to call his insurance to find out.  Patient states he will call insurance and give [place] a call back if he finds anything out.

## 2020-08-06 NOTE — Telephone Encounter (Signed)
Faxed first level appeal to [facility] @ [phone]

## 2020-08-14 ENCOUNTER — Telehealth: Payer: Self-pay | Admitting: Physician Assistant

## 2020-08-14 NOTE — Telephone Encounter (Signed)
Spoke with patient- told him that we are still waiting on prior authorization for [facility].   Told to call us next week

## 2020-08-14 NOTE — Telephone Encounter (Signed)
Wants to know status of Stelara auth.

## 2020-08-18 ENCOUNTER — Encounter: Payer: Self-pay | Admitting: Nurse Practitioner

## 2020-08-18 ENCOUNTER — Other Ambulatory Visit: Payer: Self-pay

## 2020-08-18 ENCOUNTER — Ambulatory Visit (INDEPENDENT_AMBULATORY_CARE_PROVIDER_SITE_OTHER): Payer: BC Managed Care – PPO | Admitting: Nurse Practitioner

## 2020-08-18 VITALS — BP 154/97 | HR 71 | Temp 97.4°F | Ht 71.0 in | Wt 204.0 lb

## 2020-08-18 DIAGNOSIS — Z136 Encounter for screening for cardiovascular disorders: Secondary | ICD-10-CM | POA: Diagnosis not present

## 2020-08-18 DIAGNOSIS — Z0001 Encounter for general adult medical examination with abnormal findings: Secondary | ICD-10-CM | POA: Diagnosis not present

## 2020-08-18 DIAGNOSIS — I10 Essential (primary) hypertension: Secondary | ICD-10-CM | POA: Diagnosis not present

## 2020-08-18 DIAGNOSIS — Z0000 Encounter for general adult medical examination without abnormal findings: Secondary | ICD-10-CM | POA: Insufficient documentation

## 2020-08-18 MED ORDER — LISINOPRIL 10 MG PO TABS: 10.0000 mg | ORAL_TABLET | Freq: Every day | ORAL | 3 refills | Status: DC

## 2020-08-18 NOTE — Progress Notes (Signed)
New Patient Note  RE: Mario Bradley MRN: [id number] DOB: 1970-08-03 Date of Office Visit: 08/18/2020  Chief Complaint: New Patient (Initial Visit) and Annual Exam  History of Present Illness:  .   Encounter for general adult medical examination Physical: Patient's last physical exam was 3 year ago .  Weight: Appropriate for height (BMI greater than 27%) ; 28.45 kg/mm Blood Pressure: Normal (BP greater than 120/80) ; 154/97 Medical History: Patient history reviewed ; Family history reviewed ;  Allergies Reviewed: No change in current allergies ;  Medications Reviewed: Medications reviewed - no changes ;  Lipids: Normal lipid levels ; labs completed results pending. Smoking: Life-long-smoker ; marijuana  physical Activity: Exercises at least 3 times per week ; yes Alcohol/Drug Use: Is a social-drinker ; No illicit drug use ;   Safety: reviewed ; Patient wears a seat belt, has smoke detectors, has carbon monoxide detectors, practices appropriate gun safety, and wears sunscreen with extended sun exposure. Dental Care: biannual cleanings, brushes and flosses daily. Ophthalmology/Optometry: Annual visit.  Hearing loss: none Vision impairments: Wears prescription glasses.   Hypertension: Patient here for follow-up of elevated blood pressure. He is exercising and is adherent to low salt diet.  Blood pressure is not well controlled at home. Cardiac symptoms none. Patient denies chest pain, chest pressure/discomfort, dyspnea and fatigue.  Cardiovascular risk factors: male gender. Use of agents associated with hypertension: none. History of target organ damage: none.  Assessment and Plan: Mario Bradley is a 50 y.o. male with: Annual physical exam Completed head to toe assessment for annual physical exam.  No new concerns.  Preventative health and health maintenance education provided to patient with printed handouts given. Completed labs-CBC, CMP, lipid panel, TSH, and PSA. Patient completed  colonoscopy and reassess his often due to history of colon cancer.  Follow-up annual physical in 1 year.  Essential hypertension Symptoms of hypertension not well controlled.  Patient is reporting several doctors visit with elevated blood pressure and has always had elevated blood pressure even when monitored at home.  Patient is now willing to come back in 3 months after diet and exercise with low-sodium diet and would prefer to start a blood pressure regimen today. Education provided to patient with printed handouts given. Started patient on 10 mg lisinopril once daily.  Continue blood pressure log twice daily before and after medication.  Call in blood pressure log in a week and follow-up in 2 weeks.  Patient verbalized understanding.   Return in about 2 weeks (around 09/01/2020), or if symptoms worsen or fail to improve, for hypertension.   Diagnostics:   Past Medical History: Patient Active Problem List   Diagnosis Date Noted  . Annual physical exam 08/18/2020  . Essential hypertension 08/18/2020  . BMI 28.0-28.9,adult    Past Medical History:  Diagnosis Date  . BMI 28.0-28.9,adult   . Cancer [facility]) 1995   colon   Past Surgical History: Past Surgical History:  Procedure Laterality Date  . COLON SURGERY  1995   colon CA   Medication List:  Current Outpatient Medications  Medication Sig Dispense Refill  . lisinopril (ZESTRIL) 10 MG tablet Take 1 tablet (10 mg total) by mouth daily. 90 tablet 3  . Ustekinumab (STELARA) 45 MG/0.5ML SOLN Inject into the skin.     No current facility-administered medications for this visit.   Allergies: No Known Allergies Social History: Social History   Socioeconomic History  . Marital status: Single    Spouse name: Not on file  .  Number of children: Not on file  . Years of education: Not on file  . Highest education level: Not on file  Occupational History  . Not on file  Tobacco Use  . Smoking status: Never Smoker  .  Smokeless tobacco: Former Network engineer  . Vaping Use: Never used  Substance and Sexual Activity  . Alcohol use: Yes    Comment: occ  . Drug use: Not Currently  . Sexual activity: Not on file  Other Topics Concern  . Not on file  Social History Narrative  . Not on file   Social Determinants of Health   Financial Resource Strain: Not on file  Food Insecurity: Not on file  Transportation Needs: Not on file  Physical Activity: Not on file  Stress: Not on file  Social Connections: Not on file       Family History: Family History  Problem Relation Age of Onset  . Hypertension Mother   . Hypertension Father          Review of Systems  Constitutional: Negative.   HENT: Negative.   Eyes: Negative.   Respiratory: Negative.   Cardiovascular: Negative.   Gastrointestinal: Negative.   Endocrine: Negative.   Genitourinary: Negative.   Musculoskeletal: Negative.   Skin: Negative.   Neurological: Negative for light-headedness, numbness and headaches.  Hematological: Negative.   Psychiatric/Behavioral: Negative.   All other systems reviewed and are negative.  Objective: BP (!) 154/97   Pulse 71   Temp (!) 97.4 F (36.3 C) (Temporal)   Ht 5\' 11"  (1.803 m)   Wt 204 lb (92.5 kg)   SpO2 100%   BMI 28.45 kg/m  Body mass index is 28.45 kg/m. Physical Exam Vitals reviewed.  Constitutional:      General: He is awake.     Appearance: Normal appearance. He is well-groomed.     Interventions: Face mask in place.  HENT:     Head: Normocephalic.     Right Ear: Ear canal and external ear normal. There is no impacted cerumen.     Left Ear: Ear canal and external ear normal. There is no impacted cerumen.     Nose: Nose normal.     Mouth/Throat:     Mouth: Mucous membranes are moist.     Pharynx: Oropharynx is clear.  Eyes:     General:        Right eye: No discharge.        Left eye: No discharge.     Conjunctiva/sclera: Conjunctivae normal.     Pupils: Pupils are  equal, round, and reactive to light.  Cardiovascular:     Rate and Rhythm: Normal rate and regular rhythm.     Pulses: Normal pulses.     Heart sounds: Normal heart sounds.  Pulmonary:     Effort: Pulmonary effort is normal.     Breath sounds: Normal breath sounds.  Abdominal:     General: Bowel sounds are normal.  Musculoskeletal:        General: Normal range of motion.     Cervical back: Normal range of motion.  Skin:    General: Skin is warm.  Neurological:     Mental Status: He is alert and oriented to person, place, and time.  Psychiatric:        Mood and Affect: Mood normal.        Behavior: Behavior normal. Behavior is cooperative.    The plan was reviewed with the patient/family, and all questions/concerned  were addressed.  It was my pleasure to see Harlie today and participate in his care. Please feel free to contact me with any questions or concerns.  Sincerely,  Jac Canavan NP [place]

## 2020-08-18 NOTE — Assessment & Plan Note (Signed)
Symptoms of hypertension not well controlled.  Patient is reporting several doctors visit with elevated blood pressure and has always had elevated blood pressure even when monitored at home.  Patient is now willing to come back in 3 months after diet and exercise with low-sodium diet and would prefer to start a blood pressure regimen today. Education provided to patient with printed handouts given. Started patient on 10 mg lisinopril once daily.  Continue blood pressure log twice daily before and after medication.  Call in blood pressure log in a week and follow-up in 2 weeks.  Patient verbalized understanding.

## 2020-08-18 NOTE — Patient Instructions (Addendum)
Health Maintenance, Male Adopting a healthy lifestyle and getting preventive care are important in promoting health and wellness. Ask your health care provider about:  The right schedule for you to have regular tests and exams.  Things you can do on your own to prevent diseases and keep yourself healthy. What should I know about diet, weight, and exercise? Eat a healthy diet  Eat a diet that includes plenty of vegetables, fruits, low-fat dairy products, and lean protein.  Do not eat a lot of foods that are high in solid fats, added sugars, or sodium.   Maintain a healthy weight Body mass index (BMI) is a measurement that can be used to identify possible weight problems. It estimates body fat based on height and weight. Your health care provider can help determine your BMI and help you achieve or maintain a healthy weight. Get regular exercise Get regular exercise. This is one of the most important things you can do for your health. Most adults should:  Exercise for at least 150 minutes each week. The exercise should increase your heart rate and make you sweat (moderate-intensity exercise).  Do strengthening exercises at least twice a week. This is in addition to the moderate-intensity exercise.  Spend less time sitting. Even light physical activity can be beneficial. Watch cholesterol and blood lipids Have your blood tested for lipids and cholesterol at 52 years of age, then have this test every 5 years. You may need to have your cholesterol levels checked more often if:  Your lipid or cholesterol levels are high.  You are older than 52 years of age.  You are at high risk for heart disease. What should I know about cancer screening? Many types of cancers can be detected early and may often be prevented. Depending on your health history and family history, you may need to have cancer screening at various ages. This may include screening for:  Colorectal cancer.  Prostate  cancer.  Skin cancer.  Lung cancer. What should I know about heart disease, diabetes, and high blood pressure? Blood pressure and heart disease  High blood pressure causes heart disease and increases the risk of stroke. This is more likely to develop in people who have high blood pressure readings, are of African descent, or are overweight.  Talk with your health care provider about your target blood pressure readings.  Have your blood pressure checked: ? Every 3-5 years if you are 18-39 years of age. ? Every year if you are 40 years old or older.  If you are between the ages of 65 and 75 and are a current or former smoker, ask your health care provider if you should have a one-time screening for abdominal aortic aneurysm (AAA). Diabetes Have regular diabetes screenings. This checks your fasting blood sugar level. Have the screening done:  Once every three years after age 45 if you are at a normal weight and have a low risk for diabetes.  More often and at a younger age if you are overweight or have a high risk for diabetes. What should I know about preventing infection? Hepatitis B If you have a higher risk for hepatitis B, you should be screened for this virus. Talk with your health care provider to find out if you are at risk for hepatitis B infection. Hepatitis C Blood testing is recommended for:  Everyone born from 1945 through 1965.  Anyone with known risk factors for hepatitis C. Sexually transmitted infections (STIs)  You should be screened each   year for STIs, including gonorrhea and chlamydia, if: ? You are sexually active and are younger than 52 years of age. ? You are older than 52 years of age and your health care provider tells you that you are at risk for this type of infection. ? Your sexual activity has changed since you were last screened, and you are at increased risk for chlamydia or gonorrhea. Ask your health care provider if you are at risk.  Ask your  health care provider about whether you are at high risk for HIV. Your health care provider may recommend a prescription medicine to help prevent HIV infection. If you choose to take medicine to prevent HIV, you should first get tested for HIV. You should then be tested every 3 months for as long as you are taking the medicine. Follow these instructions at home: Lifestyle  Do not use any products that contain nicotine or tobacco, such as cigarettes, e-cigarettes, and chewing tobacco. If you need help quitting, ask your health care provider.  Do not use street drugs.  Do not share needles.  Ask your health care provider for help if you need support or information about quitting drugs. Alcohol use  Do not drink alcohol if your health care provider tells you not to drink.  If you drink alcohol: ? Limit how much you have to 0-2 drinks a day. ? Be aware of how much alcohol is in your drink. In the [place] one drink equals one 12 oz bottle of beer (355 mL), one 5 oz glass of wine (148 mL), or one 1 oz glass of hard liquor (44 mL). General instructions  Schedule regular health, dental, and eye exams.  Stay current with your vaccines.  Tell your health care provider if: ? You often feel depressed. ? You have ever been abused or do not feel safe at home. Summary  Adopting a healthy lifestyle and getting preventive care are important in promoting health and wellness.  Follow your health care provider's instructions about healthy diet, exercising, and getting tested or screened for diseases.  Follow your health care provider's instructions on monitoring your cholesterol and blood pressure. This information is not intended to replace advice given to you by your health care provider. Make sure you discuss any questions you have with your health care provider. Document Revised: 04/26/2018 Document Reviewed: 04/26/2018 Elsevier Patient Education  2021 [employer].  Hypertension,  Adult Hypertension is another name for high blood pressure. High blood pressure forces your heart to work harder to pump blood. This can cause problems over time. There are two numbers in a blood pressure reading. There is a top number (systolic) over a bottom number (diastolic). It is best to have a blood pressure that is below 120/80. Healthy choices can help lower your blood pressure, or you may need medicine to help lower it. What are the causes? The cause of this condition is not known. Some conditions may be related to high blood pressure. What increases the risk?  Smoking.  Having type 2 diabetes mellitus, high cholesterol, or both.  Not getting enough exercise or physical activity.  Being overweight.  Having too much fat, sugar, calories, or salt (sodium) in your diet.  Drinking too much alcohol.  Having long-term (chronic) kidney disease.  Having a family history of high blood pressure.  Age. Risk increases with age.  Race. You may be at higher risk if you are African American.  Gender. Men are at higher risk than women before  age 48. After age 55, women are at higher risk than men.  Having obstructive sleep apnea.  Stress. What are the signs or symptoms?  High blood pressure may not cause symptoms. Very high blood pressure (hypertensive crisis) may cause: ? Headache. ? Feelings of worry or nervousness (anxiety). ? Shortness of breath. ? Nosebleed. ? A feeling of being sick to your stomach (nausea). ? Throwing up (vomiting). ? Changes in how you see. ? Very bad chest pain. ? Seizures. How is this treated?  This condition is treated by making healthy lifestyle changes, such as: ? Eating healthy foods. ? Exercising more. ? Drinking less alcohol.  Your health care provider may prescribe medicine if lifestyle changes are not enough to get your blood pressure under control, and if: ? Your top number is above 130. ? Your bottom number is above 80.  Your  personal target blood pressure may vary. Follow these instructions at home: Eating and drinking  If told, follow the DASH eating plan. To follow this plan: ? Fill one half of your plate at each meal with fruits and vegetables. ? Fill one fourth of your plate at each meal with whole grains. Whole grains include whole-wheat pasta, brown rice, and whole-grain bread. ? Eat or drink low-fat dairy products, such as skim milk or low-fat yogurt. ? Fill one fourth of your plate at each meal with low-fat (lean) proteins. Low-fat proteins include fish, chicken without skin, eggs, beans, and tofu. ? Avoid fatty meat, cured and processed meat, or chicken with skin. ? Avoid pre-made or processed food.  Eat less than 1,500 mg of salt each day.  Do not drink alcohol if: ? Your doctor tells you not to drink. ? You are pregnant, may be pregnant, or are planning to become pregnant.  If you drink alcohol: ? Limit how much you use to:  0-1 drink a day for women.  0-2 drinks a day for men. ? Be aware of how much alcohol is in your drink. In the [place] one drink equals one 12 oz bottle of beer (355 mL), one 5 oz glass of wine (148 mL), or one 1 oz glass of hard liquor (44 mL).   Lifestyle  Work with your doctor to stay at a healthy weight or to lose weight. Ask your doctor what the best weight is for you.  Get at least 30 minutes of exercise most days of the week. This may include walking, swimming, or biking.  Get at least 30 minutes of exercise that strengthens your muscles (resistance exercise) at least 3 days a week. This may include lifting weights or doing Pilates.  Do not use any products that contain nicotine or tobacco, such as cigarettes, e-cigarettes, and chewing tobacco. If you need help quitting, ask your doctor.  Check your blood pressure at home as told by your doctor.  Keep all follow-up visits as told by your doctor. This is important.   Medicines  Take over-the-counter and  prescription medicines only as told by your doctor. Follow directions carefully.  Do not skip doses of blood pressure medicine. The medicine does not work as well if you skip doses. Skipping doses also puts you at risk for problems.  Ask your doctor about side effects or reactions to medicines that you should watch for. Contact a doctor if you:  Think you are having a reaction to the medicine you are taking.  Have headaches that keep coming back (recurring).  Feel dizzy.  Have swelling in  your ankles.  Have trouble with your vision. Get help right away if you:  Get a very bad headache.  Start to feel mixed up (confused).  Feel weak or numb.  Feel faint.  Have very bad pain in your: ? Chest. ? Belly (abdomen).  Throw up more than once.  Have trouble breathing. Summary  Hypertension is another name for high blood pressure.  High blood pressure forces your heart to work harder to pump blood.  For most people, a normal blood pressure is less than 120/80.  Making healthy choices can help lower blood pressure. If your blood pressure does not get lower with healthy choices, you may need to take medicine. This information is not intended to replace advice given to you by your health care provider. Make sure you discuss any questions you have with your health care provider. Document Revised: 01/11/2018 Document Reviewed: 01/11/2018 Elsevier Patient Education  2021 [employer].

## 2020-08-18 NOTE — Assessment & Plan Note (Signed)
Completed head to toe assessment for annual physical exam.  No new concerns.  Preventative health and health maintenance education provided to patient with printed handouts given. Completed labs-CBC, CMP, lipid panel, TSH, and PSA. Patient completed colonoscopy and reassess his often due to history of colon cancer.  Follow-up annual physical in 1 year.

## 2020-08-19 LAB — LIPID PANEL
Chol/HDL Ratio: 3.6 ratio (ref 0.0–5.0)
Cholesterol, Total: 243 mg/dL — ABNORMAL HIGH (ref 100–199)
HDL: 68 mg/dL (ref >39)
LDL Chol Calc (NIH): 152 mg/dL — ABNORMAL HIGH (ref 0–99)
Triglycerides: 128 mg/dL (ref 0–149)
VLDL Cholesterol Cal: 23 mg/dL (ref 5–40)

## 2020-08-19 LAB — CBC WITH DIFFERENTIAL/PLATELET
Basophils Absolute: 0 10*3/uL (ref 0.0–0.2)
Basos: 1 %
EOS (ABSOLUTE): 0.1 10*3/uL (ref 0.0–0.4)
Eos: 3 %
Hematocrit: 44.5 % (ref 37.5–51.0)
Hemoglobin: 15.6 g/dL (ref 13.0–17.7)
Immature Grans (Abs): 0 10*3/uL (ref 0.0–0.1)
Immature Granulocytes: 0 %
Lymphocytes Absolute: 1.4 10*3/uL (ref 0.7–3.1)
Lymphs: 39 %
MCH: 32.4 pg (ref 26.6–33.0)
MCHC: 35.1 g/dL (ref 31.5–35.7)
MCV: 92 fL (ref 79–97)
Monocytes Absolute: 0.4 10*3/uL (ref 0.1–0.9)
Monocytes: 10 %
Neutrophils Absolute: 1.7 10*3/uL (ref 1.4–7.0)
Neutrophils: 47 %
Platelets: 187 10*3/uL (ref 150–450)
RBC: 4.82 x10E6/uL (ref 4.14–5.80)
RDW: 12.6 % (ref 11.6–15.4)
WBC: 3.6 10*3/uL (ref 3.4–10.8)

## 2020-08-19 LAB — COMPREHENSIVE METABOLIC PANEL
ALT: 28 IU/L (ref 0–44)
AST: 27 IU/L (ref 0–40)
Albumin/Globulin Ratio: 1.5 (ref 1.2–2.2)
Albumin: 4.5 g/dL (ref 3.8–4.9)
Alkaline Phosphatase: 51 IU/L (ref 44–121)
BUN/Creatinine Ratio: 13 (ref 9–20)
BUN: 12 mg/dL (ref 6–24)
Bilirubin Total: 0.6 mg/dL (ref 0.0–1.2)
CO2: 22 mmol/L (ref 20–29)
Calcium: 9.8 mg/dL (ref 8.7–10.2)
Chloride: 101 mmol/L (ref 96–106)
Creatinine, Ser: 0.93 mg/dL (ref 0.76–1.27)
Globulin, Total: 3 g/dL (ref 1.5–4.5)
Glucose: 83 mg/dL (ref 65–99)
Potassium: 4.5 mmol/L (ref 3.5–5.2)
Sodium: 140 mmol/L (ref 134–144)
Total Protein: 7.5 g/dL (ref 6.0–8.5)
eGFR: 99 mL/min/{1.73_m2} (ref >59)

## 2020-08-19 LAB — TSH: TSH: 1.09 u[IU]/mL (ref 0.450–4.500)

## 2020-08-19 LAB — PSA: Prostate Specific Ag, Serum: 0.7 ng/mL (ref 0.0–4.0)

## 2020-08-20 ENCOUNTER — Telehealth: Payer: Self-pay | Admitting: *Deleted

## 2020-08-20 NOTE — Telephone Encounter (Signed)
Received letter stating patient no longer has [employer]- called patient to  [employer]. [place] was using wrong [employer] member ID number.  I called Iantha Fallen and corrected the [employer] ID number and senderra said they will resubmit to the correct insurance and give a update.

## 2020-08-21 ENCOUNTER — Other Ambulatory Visit: Payer: Self-pay | Admitting: Nurse Practitioner

## 2020-08-21 ENCOUNTER — Telehealth: Payer: Self-pay | Admitting: *Deleted

## 2020-08-21 ENCOUNTER — Telehealth: Payer: Self-pay | Admitting: Dermatology

## 2020-08-21 DIAGNOSIS — E7800 Pure hypercholesterolemia, unspecified: Secondary | ICD-10-CM | POA: Insufficient documentation

## 2020-08-21 MED ORDER — ATORVASTATIN CALCIUM 10 MG PO TABS: 10.0000 mg | ORAL_TABLET | Freq: Every day | ORAL | 0 refills | Status: DC

## 2020-08-21 NOTE — Telephone Encounter (Signed)
Got call from pharmacy ([facility]) [phone]. They told him to call us and say everything is OK and they will send the Rx for Stelara out. Said he's been talking to [facility] about what's going on.

## 2020-08-21 NOTE — Telephone Encounter (Signed)
Spoke with senderra and they are getting the PA done then we will transfer the rx to [facility] [phone]

## 2020-08-21 NOTE — Telephone Encounter (Signed)
Called patient to let him know Stelera approved 08/21/2020-08/21/2021   Reference # [id number]

## 2020-08-27 ENCOUNTER — Telehealth: Payer: Self-pay | Admitting: *Deleted

## 2020-08-27 NOTE — Telephone Encounter (Signed)
Per senderra patient must fill this medication using [facility] and senderra will transfer prescription to them.

## 2020-09-01 ENCOUNTER — Ambulatory Visit: Payer: BC Managed Care – PPO | Admitting: Nurse Practitioner

## 2020-09-03 ENCOUNTER — Other Ambulatory Visit: Payer: Self-pay

## 2020-09-03 ENCOUNTER — Ambulatory Visit: Payer: BC Managed Care – PPO | Admitting: Nurse Practitioner

## 2020-09-03 ENCOUNTER — Encounter: Payer: Self-pay | Admitting: Nurse Practitioner

## 2020-09-03 VITALS — BP 155/101 | HR 70 | Temp 97.5°F | Ht 71.0 in | Wt 203.0 lb

## 2020-09-03 DIAGNOSIS — I10 Essential (primary) hypertension: Secondary | ICD-10-CM

## 2020-09-03 MED ORDER — LISINOPRIL 20 MG PO TABS: 20.0000 mg | ORAL_TABLET | Freq: Every day | ORAL | 0 refills | Status: DC

## 2020-09-03 NOTE — Progress Notes (Signed)
Established Patient Office Visit  Subjective:  Patient ID: Mario Bradley, male    DOB: 03/28/1971  Age: 50 y.o. MRN: [id number]  CC:  Chief Complaint  Patient presents with  . Hypertension    HPI Mario Bradley presents for follow up of hypertension. Patient was diagnosed in few months ago.. The patient is tolerating the medication well without side effects. Compliance with treatment has been good; including taking medication as directed , maintains a healthy diet and regular exercise regimen , and following up as directed.  Patient's blood pressure elevated in clinic.  Patient reports blood pressure is well maintained at home.  Current blood pressure medication lisinopril 10 mg tablet by mouth daily.    Past Medical History:  Diagnosis Date  . BMI 28.0-28.9,adult   . Cancer [facility]) 1995   colon    Past Surgical History:  Procedure Laterality Date  . COLON SURGERY  1995   colon CA    Family History  Problem Relation Age of Onset  . Hypertension Mother   . Hypertension Father     Social History   Socioeconomic History  . Marital status: Single    Spouse name: Not on file  . Number of children: Not on file  . Years of education: Not on file  . Highest education level: Not on file  Occupational History  . Not on file  Tobacco Use  . Smoking status: Never Smoker  . Smokeless tobacco: Former Network engineer  . Vaping Use: Never used  Substance and Sexual Activity  . Alcohol use: Yes    Comment: occ  . Drug use: Not Currently  . Sexual activity: Not on file  Other Topics Concern  . Not on file  Social History Narrative  . Not on file   Social Determinants of Health   Financial Resource Strain: Not on file  Food Insecurity: Not on file  Transportation Needs: Not on file  Physical Activity: Not on file  Stress: Not on file  Social Connections: Not on file  Intimate Partner Violence: Not on file    Outpatient Medications Prior to Visit   Medication Sig Dispense Refill  . atorvastatin (LIPITOR) 10 MG tablet Take 1 tablet (10 mg total) by mouth daily. 90 tablet 0  . Ustekinumab (STELARA) 45 MG/0.5ML SOLN Inject into the skin.    Marland Kitchen lisinopril (ZESTRIL) 10 MG tablet Take 1 tablet (10 mg total) by mouth daily. 90 tablet 3   No facility-administered medications prior to visit.    No Known Allergies  ROS Review of Systems  Constitutional: Negative.   HENT: Negative.   Respiratory: Negative.   Cardiovascular: Negative.   Genitourinary: Negative.   Musculoskeletal: Negative.   Skin: Negative for rash.  Neurological: Negative for light-headedness and headaches.  All other systems reviewed and are negative.     Objective:    Physical Exam Vitals and nursing note reviewed.  Constitutional:      Appearance: Normal appearance.  HENT:     Head: Normocephalic.     Nose: Nose normal.  Cardiovascular:     Rate and Rhythm: Normal rate and regular rhythm.     Pulses: Normal pulses.     Heart sounds: Normal heart sounds.  Pulmonary:     Effort: Pulmonary effort is normal.     Breath sounds: Normal breath sounds.  Abdominal:     General: Bowel sounds are normal.  Musculoskeletal:        General: Normal range  of motion.  Skin:    General: Skin is warm.  Neurological:     Mental Status: He is alert and oriented to person, place, and time.  Psychiatric:        Behavior: Behavior normal.     BP (!) 155/101   Pulse 70   Temp (!) 97.5 F (36.4 C) (Temporal)   Ht 5\' 11"  (1.803 m)   Wt 203 lb (92.1 kg)   SpO2 99%   BMI 28.31 kg/m  Wt Readings from Last 3 Encounters:  09/03/20 203 lb (92.1 kg)  08/18/20 204 lb (92.5 kg)     Health Maintenance Due  Topic Date Due  . Hepatitis C Screening  Never done  . COVID-19 Vaccine (1) Never done  . HIV Screening  Never done  . COLONOSCOPY (Pts 45-4yrs Insurance coverage will need to be confirmed)  Never done    There are no preventive care reminders to display for  this patient.  Lab Results  Component Value Date   TSH 1.090 08/18/2020   Lab Results  Component Value Date   WBC 3.6 08/18/2020   HGB 15.6 08/18/2020   HCT 44.5 08/18/2020   MCV 92 08/18/2020   PLT 187 08/18/2020   Lab Results  Component Value Date   NA 140 08/18/2020   K 4.5 08/18/2020   CO2 22 08/18/2020   GLUCOSE 83 08/18/2020   BUN 12 08/18/2020   CREATININE 0.93 08/18/2020   BILITOT 0.6 08/18/2020   ALKPHOS 51 08/18/2020   AST 27 08/18/2020   ALT 28 08/18/2020   PROT 7.5 08/18/2020   ALBUMIN 4.5 08/18/2020   CALCIUM 9.8 08/18/2020   Lab Results  Component Value Date   CHOL 243 (H) 08/18/2020   Lab Results  Component Value Date   HDL 68 08/18/2020   Lab Results  Component Value Date   LDLCALC 152 (H) 08/18/2020   Lab Results  Component Value Date   TRIG 128 08/18/2020   Lab Results  Component Value Date   CHOLHDL 3.6 08/18/2020   No results found for: HGBA1C    Assessment & Plan:   Problem List Items Addressed This Visit      Cardiovascular and Mediastinum   Essential hypertension - Primary    Blood pressure numbers/values at home and in clinic are contradictory.  Patient reports compliance with treatment and values at home is well controlled.  At home blood pressure readings are between 135/83 on a daily basis in the last 7 days.  I provided education to patient with printed handouts given.  On low-sodium diet, hydration exercise as tolerated and continue to keep blood pressure log.  I changed lisinopril 10 mg tablet to 20 mg tablet daily by mouth.  Keep blood pressure log and call in values in 1 week. Follow-up with worsening unresolved symptoms.  Rx sent to pharmacy.      Relevant Medications   lisinopril (ZESTRIL) 20 MG tablet      Meds ordered this encounter  Medications  . lisinopril (ZESTRIL) 20 MG tablet    Sig: Take 1 tablet (20 mg total) by mouth daily.    Dispense:  90 tablet    Refill:  0    Order Specific Question:    Supervising Provider    Answer:   Janora Norlander [[id number]]    Follow-up: Return in about 4 weeks (around 10/01/2020).    Ivy Lynn, NP

## 2020-09-03 NOTE — Assessment & Plan Note (Signed)
Blood pressure numbers/values at home and in clinic are contradictory.  Patient reports compliance with treatment and values at home is well controlled.  At home blood pressure readings are between 135/83 on a daily basis in the last 7 days.  I provided education to patient with printed handouts given.  On low-sodium diet, hydration exercise as tolerated and continue to keep blood pressure log.  I changed lisinopril 10 mg tablet to 20 mg tablet daily by mouth.  Keep blood pressure log and call in values in 1 week. Follow-up with worsening unresolved symptoms.  Rx sent to pharmacy.

## 2020-09-03 NOTE — Patient Instructions (Signed)
Call BP log in in 5-7 days  Hypertension, Adult Hypertension is another name for high blood pressure. High blood pressure forces your heart to work harder to pump blood. This can cause problems over time. There are two numbers in a blood pressure reading. There is a top number (systolic) over a bottom number (diastolic). It is best to have a blood pressure that is below 120/80. Healthy choices can help lower your blood pressure, or you may need medicine to help lower it. What are the causes? The cause of this condition is not known. Some conditions may be related to high blood pressure. What increases the risk?  Smoking.  Having type 2 diabetes mellitus, high cholesterol, or both.  Not getting enough exercise or physical activity.  Being overweight.  Having too much fat, sugar, calories, or salt (sodium) in your diet.  Drinking too much alcohol.  Having long-term (chronic) kidney disease.  Having a family history of high blood pressure.  Age. Risk increases with age.  Race. You may be at higher risk if you are African American.  Gender. Men are at higher risk than women before age 41. After age 41, women are at higher risk than men.  Having obstructive sleep apnea.  Stress. What are the signs or symptoms?  High blood pressure may not cause symptoms. Very high blood pressure (hypertensive crisis) may cause: ? Headache. ? Feelings of worry or nervousness (anxiety). ? Shortness of breath. ? Nosebleed. ? A feeling of being sick to your stomach (nausea). ? Throwing up (vomiting). ? Changes in how you see. ? Very bad chest pain. ? Seizures. How is this treated?  This condition is treated by making healthy lifestyle changes, such as: ? Eating healthy foods. ? Exercising more. ? Drinking less alcohol.  Your health care provider may prescribe medicine if lifestyle changes are not enough to get your blood pressure under control, and if: ? Your top number is above  130. ? Your bottom number is above 80.  Your personal target blood pressure may vary. Follow these instructions at home: Eating and drinking  If told, follow the DASH eating plan. To follow this plan: ? Fill one half of your plate at each meal with fruits and vegetables. ? Fill one fourth of your plate at each meal with whole grains. Whole grains include whole-wheat pasta, brown rice, and whole-grain bread. ? Eat or drink low-fat dairy products, such as skim milk or low-fat yogurt. ? Fill one fourth of your plate at each meal with low-fat (lean) proteins. Low-fat proteins include fish, chicken without skin, eggs, beans, and tofu. ? Avoid fatty meat, cured and processed meat, or chicken with skin. ? Avoid pre-made or processed food.  Eat less than 1,500 mg of salt each day.  Do not drink alcohol if: ? Your doctor tells you not to drink. ? You are pregnant, may be pregnant, or are planning to become pregnant.  If you drink alcohol: ? Limit how much you use to:  0-1 drink a day for women.  0-2 drinks a day for men. ? Be aware of how much alcohol is in your drink. In the [place] one drink equals one 12 oz bottle of beer (355 mL), one 5 oz glass of wine (148 mL), or one 1 oz glass of hard liquor (44 mL).   Lifestyle  Work with your doctor to stay at a healthy weight or to lose weight. Ask your doctor what the best weight is for you.  Get at least 30 minutes of exercise most days of the week. This may include walking, swimming, or biking.  Get at least 30 minutes of exercise that strengthens your muscles (resistance exercise) at least 3 days a week. This may include lifting weights or doing Pilates.  Do not use any products that contain nicotine or tobacco, such as cigarettes, e-cigarettes, and chewing tobacco. If you need help quitting, ask your doctor.  Check your blood pressure at home as told by your doctor.  Keep all follow-up visits as told by your doctor. This is important.    Medicines  Take over-the-counter and prescription medicines only as told by your doctor. Follow directions carefully.  Do not skip doses of blood pressure medicine. The medicine does not work as well if you skip doses. Skipping doses also puts you at risk for problems.  Ask your doctor about side effects or reactions to medicines that you should watch for. Contact a doctor if you:  Think you are having a reaction to the medicine you are taking.  Have headaches that keep coming back (recurring).  Feel dizzy.  Have swelling in your ankles.  Have trouble with your vision. Get help right away if you:  Get a very bad headache.  Start to feel mixed up (confused).  Feel weak or numb.  Feel faint.  Have very bad pain in your: ? Chest. ? Belly (abdomen).  Throw up more than once.  Have trouble breathing. Summary  Hypertension is another name for high blood pressure.  High blood pressure forces your heart to work harder to pump blood.  For most people, a normal blood pressure is less than 120/80.  Making healthy choices can help lower blood pressure. If your blood pressure does not get lower with healthy choices, you may need to take medicine. This information is not intended to replace advice given to you by your health care provider. Make sure you discuss any questions you have with your health care provider. Document Revised: 01/11/2018 Document Reviewed: 01/11/2018 Elsevier Patient Education  2021 [employer].

## 2020-11-24 ENCOUNTER — Other Ambulatory Visit: Payer: Self-pay | Admitting: *Deleted

## 2020-11-24 DIAGNOSIS — E7800 Pure hypercholesterolemia, unspecified: Secondary | ICD-10-CM

## 2020-11-24 MED ORDER — ATORVASTATIN CALCIUM 10 MG PO TABS: 10.0000 mg | ORAL_TABLET | Freq: Every day | ORAL | 0 refills | Status: DC

## 2020-12-11 ENCOUNTER — Other Ambulatory Visit: Payer: Self-pay

## 2020-12-11 DIAGNOSIS — I10 Essential (primary) hypertension: Secondary | ICD-10-CM

## 2020-12-11 MED ORDER — LISINOPRIL 20 MG PO TABS: 20.0000 mg | ORAL_TABLET | Freq: Every day | ORAL | 0 refills | Status: DC

## 2021-01-01 ENCOUNTER — Encounter: Payer: Self-pay | Admitting: Physician Assistant

## 2021-01-01 ENCOUNTER — Ambulatory Visit (INDEPENDENT_AMBULATORY_CARE_PROVIDER_SITE_OTHER): Payer: 59 | Admitting: Physician Assistant

## 2021-01-01 ENCOUNTER — Other Ambulatory Visit: Payer: Self-pay

## 2021-01-01 DIAGNOSIS — L309 Dermatitis, unspecified: Secondary | ICD-10-CM | POA: Diagnosis not present

## 2021-01-01 MED ORDER — MINOCYCLINE HCL 100 MG PO CAPS: 100.0000 mg | ORAL_CAPSULE | Freq: Two times a day (BID) | ORAL | 1 refills | Status: DC

## 2021-01-01 MED ORDER — TACROLIMUS 0.1 % EX OINT: TOPICAL_OINTMENT | Freq: Two times a day (BID) | CUTANEOUS | 2 refills | Status: DC

## 2021-01-01 NOTE — Progress Notes (Signed)
   Follow-Up Visit   Subjective  Mario Bradley is a 50 y.o. male who presents for the following: Skin Problem (Patient has a pink lesion on his face. Comes and goes. X 2 years. Some itching but no bleeding unless patient scratches. Patient is on stelara for psoriasis. Dr. Denna Haggard gave him amzeeq. It did not help.).   The following portions of the chart were reviewed this encounter and updated as appropriate:  Tobacco  Allergies  Meds  Problems  Med Hx  Surg Hx  Fam Hx      Objective  Well appearing patient in no apparent distress; mood and affect are within normal limits.  A focused examination was performed including face. Relevant physical exam findings are noted in the Assessment and Plan.  Right Buccal Cheek Hyperpigmented macules with a dense papule in each of the two lesions. Buccal cheek is slightly crusty.   Assessment & Plan  Dermatitis Right Buccal Cheek  minocycline (MINOCIN) 100 MG capsule - Right Buccal Cheek Take 1 capsule (100 mg total) by mouth 2 (two) times daily.  tacrolimus (PROTOPIC) 0.1 % ointment - Right Buccal Cheek Apply topically 2 (two) times daily.    I, Barrett Goldie, PA-C, have reviewed all documentation's for this visit.  The documentation on 01/01/21 for the exam, diagnosis, procedures and orders are all accurate and complete.

## 2021-02-06 ENCOUNTER — Other Ambulatory Visit: Payer: Self-pay | Admitting: *Deleted

## 2021-02-06 DIAGNOSIS — I10 Essential (primary) hypertension: Secondary | ICD-10-CM

## 2021-02-06 NOTE — Telephone Encounter (Signed)
Je NTBS. 30 days given 12/11/20, RF by pharmacy on 01/10/21. Pt was RTC 4 wks from 09/03/20 OV

## 2021-02-11 ENCOUNTER — Encounter: Payer: Self-pay | Admitting: Nurse Practitioner

## 2021-02-11 ENCOUNTER — Other Ambulatory Visit: Payer: Self-pay

## 2021-02-11 ENCOUNTER — Ambulatory Visit (INDEPENDENT_AMBULATORY_CARE_PROVIDER_SITE_OTHER): Payer: 59 | Admitting: Nurse Practitioner

## 2021-02-11 VITALS — BP 152/89 | HR 87 | Temp 97.5°F | Resp 20 | Ht 71.0 in | Wt 200.0 lb

## 2021-02-11 DIAGNOSIS — E7800 Pure hypercholesterolemia, unspecified: Secondary | ICD-10-CM

## 2021-02-11 DIAGNOSIS — I10 Essential (primary) hypertension: Secondary | ICD-10-CM

## 2021-02-11 MED ORDER — LISINOPRIL 20 MG PO TABS: 20.0000 mg | ORAL_TABLET | Freq: Every day | ORAL | 5 refills | Status: DC

## 2021-02-11 NOTE — Assessment & Plan Note (Signed)
Completed lipid panel labs results pending.  Continue low-cholesterol diet.  No new signs and symptoms of hyper or hypercholesterolemia. Education provided to patient with printed handouts given.  Follow-up in 6 months.

## 2021-02-11 NOTE — Patient Instructions (Signed)

## 2021-02-11 NOTE — Assessment & Plan Note (Signed)
Patient continues to report lower blood pressure values at home.  He reports he is probably experiencing some whitecoat syndrome and has not had any signs or symptoms of elevated blood pressure.  I continue to provide education to patient to keep blood pressure below 130/80.  And continue medication as prescribed.  Patient will take a blood pressure log for 1 week and return values to clinic.  Continue low-sodium diet exercise at least 3 times a week.  CBC, CMP lab results pending.  Rx refill sent to pharmacy.

## 2021-02-11 NOTE — Progress Notes (Signed)
New Patient Office Visit  Subjective:  Patient ID: Mario Bradley, male    DOB: [birth year]  Age: 50 y.o. MRN: [id number]  CC:  Chief Complaint  Patient presents with   Recheck BP    HPI Mario Bradley presents for follow up of hypertension. Patient was diagnosed in 08/18/2020. The patient is tolerating the medication well without side effects. Compliance with treatment has been good; including taking medication as directed , maintains a healthy diet and regular exercise regimen , and following up as directed. Current medication Lisinopril 20 mg by mouth daily.   Mixed hyperlipidemia   Patient presents with hyperlipidemia. Patient was diagnosed in 08/21/2020. Compliance with treatment has been good The patient is compliant with medications, maintains a low cholesterol diet , follows up as directed , and maintains an exercise regimen . The patient denies experiencing any hypercholesterolemia related symptoms.    Past Medical History:  Diagnosis Date   BMI 28.0-28.9,adult    Cancer ([place]) 1995   colon    Past Surgical History:  Procedure Laterality Date   COLON SURGERY  1995   colon CA    Family History  Problem Relation Age of Onset   Hypertension Mother    Hypertension Father     Social History   Socioeconomic History   Marital status: Single    Spouse name: Not on file   Number of children: Not on file   Years of education: Not on file   Highest education level: Not on file  Occupational History   Not on file  Tobacco Use   Smoking status: Never   Smokeless tobacco: Former  Scientific laboratory technician Use: Never used  Substance and Sexual Activity   Alcohol use: Yes    Comment: occ   Drug use: Not Currently   Sexual activity: Not on file  Other Topics Concern   Not on file  Social History Narrative   Not on file   Social Determinants of Health   Financial Resource Strain: Not on file  Food Insecurity: Not on file  Transportation Needs: Not on file   Physical Activity: Not on file  Stress: Not on file  Social Connections: Not on file  Intimate Partner Violence: Not on file    ROS Review of Systems  Constitutional: Negative.   HENT: Negative.    Respiratory: Negative.    Gastrointestinal: Negative.   Genitourinary: Negative.   Musculoskeletal: Negative.   Skin:  Negative for rash.  Neurological:  Negative for dizziness and headaches.  All other systems reviewed and are negative.  Objective:   Today's Vitals: BP (!) 152/89   Pulse 87   Temp (!) 97.5 F (36.4 C) (Temporal)   Resp 20   Ht 5\' 11"  (1.803 m)   Wt 200 lb (90.7 kg)   SpO2 97%   BMI 27.89 kg/m   Physical Exam Vitals and nursing note reviewed.  Constitutional:      Appearance: Normal appearance.  HENT:     Head: Normocephalic.     Right Ear: Ear canal and external ear normal.     Left Ear: Ear canal and external ear normal.     Nose: Nose normal.     Mouth/Throat:     Mouth: Mucous membranes are moist.     Pharynx: Oropharynx is clear.  Eyes:     Conjunctiva/sclera: Conjunctivae normal.  Cardiovascular:     Rate and Rhythm: Normal rate and regular rhythm.     Pulses:  Normal pulses.     Heart sounds: Normal heart sounds.  Pulmonary:     Effort: Pulmonary effort is normal.     Breath sounds: Normal breath sounds.  Abdominal:     General: Bowel sounds are normal.  Skin:    Findings: No rash.  Neurological:     Mental Status: He is alert and oriented to person, place, and time.  Psychiatric:        Behavior: Behavior normal.    Assessment & Plan:   Problem List Items Addressed This Visit       Cardiovascular and Mediastinum   Essential hypertension - Primary    Patient continues to report lower blood pressure values at home.  He reports he is probably experiencing some whitecoat syndrome and has not had any signs or symptoms of elevated blood pressure.  I continue to provide education to patient to keep blood pressure below 130/80.  And  continue medication as prescribed.  Patient will take a blood pressure log for 1 week and return values to clinic.  Continue low-sodium diet exercise at least 3 times a week.  CBC, CMP lab results pending.  Rx refill sent to pharmacy.      Relevant Medications   lisinopril (ZESTRIL) 20 MG tablet   Other Relevant Orders   CBC with Differential   Comprehensive metabolic panel     Other   Elevated cholesterol    Completed lipid panel labs results pending.  Continue low-cholesterol diet.  No new signs and symptoms of hyper or hypercholesterolemia. Education provided to patient with printed handouts given.  Follow-up in 6 months.      Relevant Medications   lisinopril (ZESTRIL) 20 MG tablet   Other Relevant Orders   Lipid Panel    Outpatient Encounter Medications as of 02/11/2021  Medication Sig   atorvastatin (LIPITOR) 10 MG tablet Take 1 tablet (10 mg total) by mouth daily. (NEEDS TO BE SEEN BEFORE NEXT REFILL)   minocycline (MINOCIN) 100 MG capsule Take 1 capsule (100 mg total) by mouth 2 (two) times daily.   [DISCONTINUED] lisinopril (ZESTRIL) 20 MG tablet Take 1 tablet (20 mg total) by mouth daily.   lisinopril (ZESTRIL) 20 MG tablet Take 1 tablet (20 mg total) by mouth daily.   [DISCONTINUED] STELARA 45 MG/0.5ML SOSY injection Inject into the skin.   [DISCONTINUED] tacrolimus (PROTOPIC) 0.1 % ointment Apply topically 2 (two) times daily.   [DISCONTINUED] Ustekinumab (STELARA) 45 MG/0.5ML SOLN Inject into the skin.   No facility-administered encounter medications on file as of 02/11/2021.    Follow-up: Return in about 6 months (around 08/11/2021).   Ivy Lynn, NP

## 2021-02-12 LAB — LIPID PANEL
Chol/HDL Ratio: 4.3 ratio (ref 0.0–5.0)
Cholesterol, Total: 261 mg/dL — ABNORMAL HIGH (ref 100–199)
HDL: 61 mg/dL (ref >39)
LDL Chol Calc (NIH): 165 mg/dL — ABNORMAL HIGH (ref 0–99)
Triglycerides: 194 mg/dL — ABNORMAL HIGH (ref 0–149)
VLDL Cholesterol Cal: 35 mg/dL (ref 5–40)

## 2021-02-12 LAB — COMPREHENSIVE METABOLIC PANEL
ALT: 35 IU/L (ref 0–44)
AST: 26 IU/L (ref 0–40)
Albumin/Globulin Ratio: 2 (ref 1.2–2.2)
Albumin: 4.7 g/dL (ref 3.8–4.9)
Alkaline Phosphatase: 59 IU/L (ref 44–121)
BUN/Creatinine Ratio: 11 (ref 9–20)
BUN: 11 mg/dL (ref 6–24)
Bilirubin Total: 1 mg/dL (ref 0.0–1.2)
CO2: 21 mmol/L (ref 20–29)
Calcium: 8.9 mg/dL (ref 8.7–10.2)
Chloride: 102 mmol/L (ref 96–106)
Creatinine, Ser: 0.96 mg/dL (ref 0.76–1.27)
Globulin, Total: 2.4 g/dL (ref 1.5–4.5)
Glucose: 91 mg/dL (ref 70–99)
Potassium: 4.5 mmol/L (ref 3.5–5.2)
Sodium: 139 mmol/L (ref 134–144)
Total Protein: 7.1 g/dL (ref 6.0–8.5)
eGFR: 96 mL/min/{1.73_m2} (ref >59)

## 2021-02-12 LAB — CBC WITH DIFFERENTIAL/PLATELET
Basophils Absolute: 0.1 10*3/uL (ref 0.0–0.2)
Basos: 2 %
EOS (ABSOLUTE): 0.1 10*3/uL (ref 0.0–0.4)
Eos: 4 %
Hematocrit: 46.9 % (ref 37.5–51.0)
Hemoglobin: 16.3 g/dL (ref 13.0–17.7)
Immature Grans (Abs): 0 10*3/uL (ref 0.0–0.1)
Immature Granulocytes: 0 %
Lymphocytes Absolute: 1.2 10*3/uL (ref 0.7–3.1)
Lymphs: 36 %
MCH: 32.3 pg (ref 26.6–33.0)
MCHC: 34.8 g/dL (ref 31.5–35.7)
MCV: 93 fL (ref 79–97)
Monocytes Absolute: 0.4 10*3/uL (ref 0.1–0.9)
Monocytes: 11 %
Neutrophils Absolute: 1.6 10*3/uL (ref 1.4–7.0)
Neutrophils: 47 %
Platelets: 189 10*3/uL (ref 150–450)
RBC: 5.04 x10E6/uL (ref 4.14–5.80)
RDW: 12.8 % (ref 11.6–15.4)
WBC: 3.4 10*3/uL (ref 3.4–10.8)

## 2021-02-13 ENCOUNTER — Other Ambulatory Visit: Payer: Self-pay | Admitting: Nurse Practitioner

## 2021-02-13 MED ORDER — ATORVASTATIN CALCIUM 20 MG PO TABS: 20.0000 mg | ORAL_TABLET | Freq: Every day | ORAL | 3 refills | Status: DC

## 2021-03-09 ENCOUNTER — Other Ambulatory Visit: Payer: Self-pay | Admitting: Physician Assistant

## 2021-03-09 DIAGNOSIS — L309 Dermatitis, unspecified: Secondary | ICD-10-CM

## 2021-04-23 ENCOUNTER — Other Ambulatory Visit: Payer: Self-pay

## 2021-04-23 ENCOUNTER — Encounter: Payer: Self-pay | Admitting: Physician Assistant

## 2021-04-23 ENCOUNTER — Ambulatory Visit (INDEPENDENT_AMBULATORY_CARE_PROVIDER_SITE_OTHER): Payer: 59 | Admitting: Physician Assistant

## 2021-04-23 DIAGNOSIS — L719 Rosacea, unspecified: Secondary | ICD-10-CM

## 2021-04-23 MED ORDER — MINOCYCLINE HCL 50 MG PO CAPS: 50.0000 mg | ORAL_CAPSULE | Freq: Two times a day (BID) | ORAL | 6 refills | Status: DC

## 2021-04-23 NOTE — Progress Notes (Signed)
   Follow-Up Visit   Subjective  Mario Bradley is a 50 y.o. male who presents for the following: Dermatitis (Patient here today for follow up on dermatitis on right buccal cheek. Per patient he is clear with 100 mg MCN daily. Per patient he's only used the tacrolimus some. ).   The following portions of the chart were reviewed this encounter and updated as appropriate:  Tobacco  Allergies  Meds  Problems  Med Hx  Surg Hx  Fam Hx      Objective  Well appearing patient in no apparent distress; mood and affect are within normal limits.  All skin waist up examined.  Left Malar Cheek, Right Malar Cheek Erythema. No scale or pustules.    Assessment & Plan  Rosacea Left Malar Cheek; Right Malar Cheek  minocycline (MINOCIN) 50 MG capsule - Left Malar Cheek, Right Malar Cheek Take 1 capsule (50 mg total) by mouth 2 (two) times daily.   Patient discontinued Stelara for psoriasis on his scalp. No recurrence for 5 months. He will call if recurs.  No atypical nevi noted at the time of the visit.   I, Iyari Hagner, PA-C, have reviewed all documentation's for this visit.  The documentation on 04/23/21 for the exam, diagnosis, procedures and orders are all accurate and complete.

## 2021-06-18 ENCOUNTER — Other Ambulatory Visit: Payer: Self-pay | Admitting: Nurse Practitioner

## 2021-06-18 DIAGNOSIS — E7800 Pure hypercholesterolemia, unspecified: Secondary | ICD-10-CM

## 2021-07-01 ENCOUNTER — Other Ambulatory Visit: Payer: Self-pay | Admitting: Nurse Practitioner

## 2021-07-01 DIAGNOSIS — E7800 Pure hypercholesterolemia, unspecified: Secondary | ICD-10-CM

## 2021-07-03 ENCOUNTER — Other Ambulatory Visit: Payer: Self-pay | Admitting: Nurse Practitioner

## 2021-07-03 DIAGNOSIS — E7800 Pure hypercholesterolemia, unspecified: Secondary | ICD-10-CM

## 2021-07-21 ENCOUNTER — Ambulatory Visit (INDEPENDENT_AMBULATORY_CARE_PROVIDER_SITE_OTHER): Payer: 59 | Admitting: Physician Assistant

## 2021-07-21 ENCOUNTER — Other Ambulatory Visit: Payer: Self-pay

## 2021-07-21 DIAGNOSIS — L309 Dermatitis, unspecified: Secondary | ICD-10-CM

## 2021-07-21 DIAGNOSIS — L719 Rosacea, unspecified: Secondary | ICD-10-CM

## 2021-07-21 MED ORDER — MINOCYCLINE HCL 50 MG PO CAPS: 50.0000 mg | ORAL_CAPSULE | Freq: Two times a day (BID) | ORAL | 11 refills | Status: DC

## 2021-07-24 ENCOUNTER — Encounter: Payer: Self-pay | Admitting: Physician Assistant

## 2021-07-24 NOTE — Progress Notes (Signed)
? ?  Follow-Up Visit ?  ?Subjective  ?Mario Bradley is a 51 y.o. male who presents for the following: Follow-up (Rosacea taking Minocycline 50 mg once a day. Its working well. ). ? ? ?The following portions of the chart were reviewed this encounter and updated as appropriate:  Tobacco  Allergies  Meds  Problems  Med Hx  Surg Hx  Fam Hx   ?  ? ?Objective  ?Well appearing patient in no apparent distress; mood and affect are within normal limits. ? ?All skin waist up examined. ? ?central face ?Centrifacial erythema without papules.  ? ? ?Assessment & Plan  ?Rosacea ?central face ? ?minocycline (MINOCIN) 50 MG capsule - central face ?Take 1 capsule (50 mg total) by mouth 2 (two) times daily. ? ?Dermatitis ? ?Related Medications ?minocycline (MINOCIN) 100 MG capsule ?TAKE 1 CAPSULE BY MOUTH TWICE A DAY ? ?Psoriasis clear today. No atypical nevi noted at the time of the visit. ? ?I, Aubreana Cornacchia, PA-C, have reviewed all documentation's for this visit.  The documentation on 07/24/21 for the exam, diagnosis, procedures and orders are all accurate and complete. ?

## 2021-08-06 ENCOUNTER — Other Ambulatory Visit: Payer: Self-pay | Admitting: Nurse Practitioner

## 2021-08-06 DIAGNOSIS — I10 Essential (primary) hypertension: Secondary | ICD-10-CM

## 2021-08-11 ENCOUNTER — Ambulatory Visit: Payer: 59 | Admitting: Nurse Practitioner

## 2021-09-03 ENCOUNTER — Ambulatory Visit: Payer: 59 | Admitting: Physician Assistant

## 2021-09-08 ENCOUNTER — Other Ambulatory Visit: Payer: Self-pay | Admitting: Nurse Practitioner

## 2021-09-08 DIAGNOSIS — I10 Essential (primary) hypertension: Secondary | ICD-10-CM

## 2021-09-09 ENCOUNTER — Encounter: Payer: Self-pay | Admitting: Physician Assistant

## 2021-09-09 ENCOUNTER — Ambulatory Visit (INDEPENDENT_AMBULATORY_CARE_PROVIDER_SITE_OTHER): Payer: 59 | Admitting: Physician Assistant

## 2021-09-09 DIAGNOSIS — R5383 Other fatigue: Secondary | ICD-10-CM

## 2021-09-09 DIAGNOSIS — Z79899 Other long term (current) drug therapy: Secondary | ICD-10-CM | POA: Diagnosis not present

## 2021-09-09 DIAGNOSIS — L400 Psoriasis vulgaris: Secondary | ICD-10-CM | POA: Diagnosis not present

## 2021-09-09 DIAGNOSIS — L409 Psoriasis, unspecified: Secondary | ICD-10-CM

## 2021-09-09 NOTE — Progress Notes (Signed)
? ?  Follow-Up Visit ?  ?Subjective  ?Mario Bradley is a 51 y.o. male who presents for the following: Follow-up (D/c stelara x 1 year and he is flaring on his  scalp, legs and butocks). He is aware he needs an up to date TB test. Date of DX 2016 treatment clobetasol, sernivo, otezla, soriatane and Stelara.  He took his last shot two weeks ago. No history of joint swelling or pain.  ? ?The following portions of the chart were reviewed this encounter and updated as appropriate:  Tobacco  Allergies  Meds  Problems  Med Hx  Surg Hx  Fam Hx   ?  ? ?Objective  ?Well appearing patient in no apparent distress; mood and affect are within normal limits. ? ?A full examination was performed including scalp, head, eyes, ears, nose, lips, neck, chest, axillae, abdomen, back, buttocks, bilateral upper extremities, bilateral lower extremities, hands, feet, fingers, toes, fingernails, and toenails. All findings within normal limits unless otherwise noted below. ? ?Gluteal Crease, lower legs and scalp, Left Lower Leg - Anterior, Right Lower Leg - Anterior, Scalp ?Erythematous plaques with thick crust.  ? ? ?Assessment & Plan  ?Psoriasis ?Left Lower Leg - Anterior; Right Lower Leg - Anterior; Gluteal Crease, lower legs and scalp; Scalp ? ?New start tremfya and TB test ? ?QuantiFERON-TB Gold Plus - Gluteal Crease, lower legs and scalp, Left Lower Leg - Anterior, Right Lower Leg - Anterior, Scalp ? ?Plaque psoriasis ? ?Related Procedures ?QuantiFERON-TB Gold Plus ? ?RX: Tremfya ? ?Encounter for long-term (current) use of medications ? ?Related Procedures ?QuantiFERON-TB Gold Plus ? ?Other fatigue ? ?Related Procedures ?QuantiFERON-TB Gold Plus ? ? ? ?I, Andriea Hasegawa, PA-C, have reviewed all documentation's for this visit.  The documentation on 09/09/21 for the exam, diagnosis, procedures and orders are all accurate and complete. ?

## 2021-09-22 ENCOUNTER — Telehealth: Payer: Self-pay | Admitting: Physician Assistant

## 2021-09-22 NOTE — Telephone Encounter (Signed)
Patient left message on office voice mail saying that he went to [facility] lab today to do the TB test. ?

## 2021-09-25 LAB — QUANTIFERON-TB GOLD PLUS
Mitogen-NIL: >10 IU/mL
NIL: 0.02 IU/mL
QuantiFERON-TB Gold Plus: NEGATIVE
TB1-NIL: 0.01 IU/mL
TB2-NIL: 0.01 IU/mL

## 2021-09-30 ENCOUNTER — Other Ambulatory Visit: Payer: Self-pay | Admitting: *Deleted

## 2021-09-30 MED ORDER — TREMFYA 100 MG/ML ~~LOC~~ SOAJ
100.0000 mg | SUBCUTANEOUS | 6 refills | Status: DC
Start: 2021-09-30 — End: 2022-02-18

## 2021-10-08 ENCOUNTER — Other Ambulatory Visit: Payer: Self-pay | Admitting: Nurse Practitioner

## 2021-10-08 DIAGNOSIS — I10 Essential (primary) hypertension: Secondary | ICD-10-CM

## 2021-10-08 NOTE — Telephone Encounter (Signed)
Je NTBS 30 days given 09/08/21

## 2021-10-09 ENCOUNTER — Encounter: Payer: Self-pay | Admitting: Nurse Practitioner

## 2021-10-09 NOTE — Telephone Encounter (Signed)
N/A Mailed letter.

## 2022-02-10 ENCOUNTER — Telehealth: Payer: Self-pay

## 2022-02-10 NOTE — Telephone Encounter (Signed)
Called patient and left a VM regarding patients Tremfya. This patient is coming to [place] from [facility] and needs a sooner appointment for him to get refills for Tremfya. [facility] is requiring a new PA to be done as well as a new Rx sent in from Dr. Nehemiah Massed.

## 2022-02-18 ENCOUNTER — Ambulatory Visit (INDEPENDENT_AMBULATORY_CARE_PROVIDER_SITE_OTHER): Payer: 59 | Admitting: Dermatology

## 2022-02-18 DIAGNOSIS — L719 Rosacea, unspecified: Secondary | ICD-10-CM | POA: Diagnosis not present

## 2022-02-18 DIAGNOSIS — Z79899 Other long term (current) drug therapy: Secondary | ICD-10-CM

## 2022-02-18 DIAGNOSIS — L409 Psoriasis, unspecified: Secondary | ICD-10-CM

## 2022-02-18 MED ORDER — TREMFYA 100 MG/ML ~~LOC~~ SOAJ: 100.0000 mg | SUBCUTANEOUS | 3 refills | Status: DC

## 2022-02-18 MED ORDER — MINOCYCLINE HCL 50 MG PO CAPS
50.0000 mg | ORAL_CAPSULE | Freq: Every day | ORAL | 6 refills | Status: DC
Start: 2022-02-18 — End: 2023-05-19

## 2022-02-18 NOTE — Progress Notes (Signed)
   New Patient Visit  Subjective  Mario Bradley is a 51 y.o. male who presents for the following: Psoriasis (Postauricular, scalp, legs, pt currently on Tremfya, last injection 01/22/2022, started this year, was on Stalara in past cleared, no hx of joint aches) and Rosacea (Face, pt taking Minocycline '50mg'$  1 po qod, no se).  The following portions of the chart were reviewed this encounter and updated as appropriate:   Tobacco  Allergies  Meds  Problems  Med Hx  Surg Hx  Fam Hx     Review of Systems:  No other skin or systemic complaints except as noted in HPI or Assessment and Plan.  Objective  Well appearing patient in no apparent distress; mood and affect are within normal limits.  A focused examination was performed including scalp. Relevant physical exam findings are noted in the Assessment and Plan.  Scalp, postauricular, legs Clear today  face Erythema face   Assessment & Plan  Psoriasis Scalp, postauricular, legs  Psoriasis - severe on systemic "biologic" treatment injections.  Psoriasis is a chronic non-curable, but treatable genetic/hereditary disease that may have other systemic features affecting other organ systems such as joints (Psoriatic Arthritis).  It is linked with heart disease, inflammatory bowel disease, non-alcoholic fatty liver disease, and depression. Significant skin psoriasis and/or psoriatic arthritis may have significant symptoms and affects activities of daily activity and often benefits from systemic "biologic" injection treatments.  These "biologic" treatments have some potential side effects including immunosuppression and require pre-treatment laboratory screening and periodic laboratory monitoring and periodic in person evaluation and monitoring by the attending dermatologist physician (long term medication management).   TB labs 09/22/2021 Pt will get other yearly biologic labs from PCP in December  Cont Tremfya '100mg'$ /ml q 8  wks  Guselkumab (TREMFYA) 100 MG/ML SOPN - Scalp, postauricular, legs Inject 100 mg into the skin every 8 (eight) weeks. For maintenance.  Rosacea face Rosacea is a chronic progressive skin condition usually affecting the face of adults, causing redness and/or acne bumps. It is treatable but not curable. It sometimes affects the eyes (ocular rosacea) as well. It may respond to topical and/or systemic medication and can flare with stress, sun exposure, alcohol, exercise, topical steroids (including hydrocortisone/cortisone 10) and some foods.  Daily application of broad spectrum spf 30+ sunscreen to face is recommended to reduce flares.  Cont Minocycline '50mg'$  1 po qod   Related Medications minocycline (MINOCIN) 50 MG capsule Take 1 capsule (50 mg total) by mouth daily.  Return in about 6 months (around 08/20/2022) for TBSE, Psoriasis f/u.  I, Othelia Pulling, RMA, am acting as scribe for Sarina Ser, MD . Documentation: I have reviewed the above documentation for accuracy and completeness, and I agree with the above.  Sarina Ser, MD

## 2022-02-18 NOTE — Patient Instructions (Signed)
Due to recent changes in healthcare laws, you may see results of your pathology and/or laboratory studies on MyChart before the doctors have had a chance to review them. We understand that in some cases there may be results that are confusing or concerning to you. Please understand that not all results are received at the same time and often the doctors may need to interpret multiple results in order to provide you with the best plan of care or course of treatment. Therefore, we ask that you please give us 2 business days to thoroughly review all your results before contacting the office for clarification. Should we see a critical lab result, you will be contacted sooner.   If You Need Anything After Your Visit  If you have any questions or concerns for your doctor, please call our main line at [phone] and press option 4 to reach your doctor's medical assistant. If no one answers, please leave a voicemail as directed and we will return your call as soon as possible. Messages left after 4 pm will be answered the following business day.   You may also send us a message via MyChart. We typically respond to MyChart messages within 1-2 business days.  For prescription refills, please ask your pharmacy to contact our office. Our fax number is [phone].  If you have an urgent issue when the clinic is closed that cannot wait until the next business day, you can page your doctor at the number below.    Please note that while we do our best to be available for urgent issues outside of office hours, we are not available 24/7.   If you have an urgent issue and are unable to reach us, you may choose to seek medical care at your doctor's office, retail clinic, urgent care center, or emergency room.  If you have a medical emergency, please immediately call 911 or go to the emergency department.  Pager Numbers  - Dr. Kowalski: [phone]  - Dr. Moye: [phone]  - Dr. Stewart:  [phone]  In the event of inclement weather, please call our main line at [phone] for an update on the status of any delays or closures.  Dermatology Medication Tips: Please keep the boxes that topical medications come in in order to help keep track of the instructions about where and how to use these. Pharmacies typically print the medication instructions only on the boxes and not directly on the medication tubes.   If your medication is too expensive, please contact our office at [phone] option 4 or send us a message through MyChart.   We are unable to tell what your co-pay for medications will be in advance as this is different depending on your insurance coverage. However, we may be able to find a substitute medication at lower cost or fill out paperwork to get insurance to cover a needed medication.   If a prior authorization is required to get your medication covered by your insurance company, please allow us 1-2 business days to complete this process.  Drug prices often vary depending on where the prescription is filled and some pharmacies may offer cheaper prices.  The website www.goodrx.com contains coupons for medications through different pharmacies. The prices here do not account for what the cost may be with help from insurance (it may be cheaper with your insurance), but the website can give you the price if you did not use any insurance.  - You can print the associated coupon and take it with   your prescription to the pharmacy.  - You may also stop by our office during regular business hours and pick up a GoodRx coupon card.  - If you need your prescription sent electronically to a different pharmacy, notify our office through [facility] MyChart or by phone at [phone] option 4.     Si Usted Necesita Algo Despus de Su Visita  Tambin puede enviarnos un mensaje a travs de MyChart. Por lo general respondemos a los mensajes de MyChart en el transcurso de 1 a 2  das hbiles.  Para renovar recetas, por favor pida a su farmacia que se ponga en contacto con nuestra oficina. Nuestro nmero de fax es el [phone].  Si tiene un asunto urgente cuando la clnica est cerrada y que no puede esperar hasta el siguiente da hbil, puede llamar/localizar a su doctor(a) al nmero que aparece a continuacin.   Por favor, tenga en cuenta que aunque hacemos todo lo posible para estar disponibles para asuntos urgentes fuera del horario de oficina, no estamos disponibles las 24 horas del da, los 7 das de la semana.   Si tiene un problema urgente y no puede comunicarse con nosotros, puede optar por buscar atencin mdica  en el consultorio de su doctor(a), en una clnica privada, en un centro de atencin urgente o en una sala de emergencias.  Si tiene una emergencia mdica, por favor llame inmediatamente al 911 o vaya a la sala de emergencias.  Nmeros de bper  - Dr. Kowalski: [phone]  - Dra. Moye: [phone]  - Dra. Stewart: [phone]  En caso de inclemencias del tiempo, por favor llame a nuestra lnea principal al [phone] para una actualizacin sobre el estado de cualquier retraso o cierre.  Consejos para la medicacin en dermatologa: Por favor, guarde las cajas en las que vienen los medicamentos de uso tpico para ayudarle a seguir las instrucciones sobre dnde y cmo usarlos. Las farmacias generalmente imprimen las instrucciones del medicamento slo en las cajas y no directamente en los tubos del medicamento.   Si su medicamento es muy caro, por favor, pngase en contacto con nuestra oficina llamando al [phone] y presione la opcin 4 o envenos un mensaje a travs de MyChart.   No podemos decirle cul ser su copago por los medicamentos por adelantado ya que esto es diferente dependiendo de la cobertura de su seguro. Sin embargo, es posible que podamos encontrar un medicamento sustituto a menor costo o llenar un formulario para que el  seguro cubra el medicamento que se considera necesario.   Si se requiere una autorizacin previa para que su compaa de seguros cubra su medicamento, por favor permtanos de 1 a 2 das hbiles para completar este proceso.  Los precios de los medicamentos varan con frecuencia dependiendo del lugar de dnde se surte la receta y alguna farmacias pueden ofrecer precios ms baratos.  El sitio web www.goodrx.com tiene cupones para medicamentos de diferentes farmacias. Los precios aqu no tienen en cuenta lo que podra costar con la ayuda del seguro (puede ser ms barato con su seguro), pero el sitio web puede darle el precio si no utiliz ningn seguro.  - Puede imprimir el cupn correspondiente y llevarlo con su receta a la farmacia.  - Tambin puede pasar por nuestra oficina durante el horario de atencin regular y recoger una tarjeta de cupones de GoodRx.  - Si necesita que su receta se enve electrnicamente a una farmacia diferente, informe a nuestra oficina a travs de MyChart de    o por telfono llamando al [phone] y presione la opcin 4.  

## 2022-02-22 ENCOUNTER — Other Ambulatory Visit: Payer: Self-pay

## 2022-02-22 DIAGNOSIS — L409 Psoriasis, unspecified: Secondary | ICD-10-CM

## 2022-02-22 MED ORDER — TREMFYA 100 MG/ML ~~LOC~~ SOAJ: 100.0000 mg | SUBCUTANEOUS | 3 refills | Status: AC

## 2022-02-22 NOTE — Progress Notes (Signed)
Pharmacy change from [facility] to [facility]

## 2022-02-23 ENCOUNTER — Encounter: Payer: Self-pay | Admitting: Dermatology

## 2022-03-29 ENCOUNTER — Ambulatory Visit: Payer: 59 | Admitting: Dermatology

## 2022-07-22 ENCOUNTER — Ambulatory Visit: Payer: 59 | Admitting: Physician Assistant

## 2022-09-01 ENCOUNTER — Ambulatory Visit: Payer: 59 | Admitting: Dermatology

## 2022-10-01 ENCOUNTER — Other Ambulatory Visit: Payer: Self-pay | Admitting: Dermatology

## 2022-10-01 DIAGNOSIS — L409 Psoriasis, unspecified: Secondary | ICD-10-CM

## 2023-02-08 ENCOUNTER — Encounter: Payer: Self-pay | Admitting: Nurse Practitioner

## 2023-02-08 ENCOUNTER — Ambulatory Visit: Payer: Commercial Managed Care - PPO | Admitting: Nurse Practitioner

## 2023-02-08 VITALS — BP 147/93 | HR 69 | Temp 98.2°F | Ht 71.0 in | Wt 215.8 lb

## 2023-02-08 DIAGNOSIS — Z0000 Encounter for general adult medical examination without abnormal findings: Secondary | ICD-10-CM | POA: Insufficient documentation

## 2023-02-08 DIAGNOSIS — Z6830 Body mass index (BMI) 30.0-30.9, adult: Secondary | ICD-10-CM

## 2023-02-08 DIAGNOSIS — E6609 Other obesity due to excess calories: Secondary | ICD-10-CM | POA: Insufficient documentation

## 2023-02-08 DIAGNOSIS — Z833 Family history of diabetes mellitus: Secondary | ICD-10-CM | POA: Insufficient documentation

## 2023-02-08 DIAGNOSIS — Z0001 Encounter for general adult medical examination with abnormal findings: Secondary | ICD-10-CM | POA: Insufficient documentation

## 2023-02-08 DIAGNOSIS — L409 Psoriasis, unspecified: Secondary | ICD-10-CM | POA: Diagnosis not present

## 2023-02-08 DIAGNOSIS — L719 Rosacea, unspecified: Secondary | ICD-10-CM | POA: Insufficient documentation

## 2023-02-08 DIAGNOSIS — I10 Essential (primary) hypertension: Secondary | ICD-10-CM | POA: Insufficient documentation

## 2023-02-08 MED ORDER — LISINOPRIL 10 MG PO TABS: 10.0000 mg | ORAL_TABLET | Freq: Every day | ORAL | 3 refills | Status: DC

## 2023-02-08 NOTE — Progress Notes (Signed)
Established Patient Office Visit  Subjective   Patient ID: Mario Bradley, male    DOB: 07-May-1971  Age: 52 y.o. MRN: [id number]  Chief Complaint  Patient presents with   Establish Care   Hypertension    Was previously on blood pressure medication but stopped taking. Went to chiropractor Friday after hurting back bp was 180/100      HPI  Mario Bradley is a 52 yrs old Garret Reddish Vet present her today to establish care and concerns increase BP. PMH HTN, Dyslipidemia, psoriasis and hx of colon cancer. He was dx with HTN a few years ago and was prescribed Lisinopril which he reports he stopped taking because BP was normal Hypertension:  BP Readings from Last 3 Encounters:  02/08/23 (!) 147/93  02/11/21 (!) 152/89  09/03/20 (!) 155/101   Wt Readings from Last 3 Encounters:  02/08/23 215 lb 12.8 oz (97.9 kg)  02/11/21 200 lb (90.7 kg)  09/03/20 203 lb (92.1 kg)     He was last seen for hypertension 2 years ago.  BP at that visit was 152/89. Management since that visit includes was on Lisinopril, which he stopped taking. He reports poor compliance with treatment. He is having side effects.  He is following a Regular diet. He is not exercising. He does not smoke. Use of agents associated with hypertension: none.  Outside blood pressures are high per client Symptoms: No chest pain No chest pressure  No palpitations No syncope  No dyspnea No orthopnea  No paroxysmal nocturnal dyspnea No lower extremity edema   Pertinent labs Lab Results  Component Value Date   CHOL 261 (H) 02/11/2021   HDL 61 02/11/2021   LDLCALC 165 (H) 02/11/2021   TRIG 194 (H) 02/11/2021   CHOLHDL 4.3 02/11/2021   Lab Results  Component Value Date   NA 139 02/11/2021   K 4.5 02/11/2021   CREATININE 0.96 02/11/2021   EGFR 96 02/11/2021   GLUCOSE 91 02/11/2021   TSH 1.090 08/18/2020     The 10-year ASCVD risk score (Arnett DK, et al., 2019) is: 8%  Hyperlipidemia:  Hx of  hyperlipidemia, and  prescribed Lipitor which he stopped taking 2 yrs ago. There is a family history of hyperlipidemia. There is not a family history of early ischemia heart disease. Will check labs before restarting the medications.  Psoriasis: history of psoriasis.  Symptoms have been ongoing for about 8 years and have been stable. The patient reports symptoms of itching, scaling, primarily affecting the elbows, legs. Lesions appear to be exacerbated by no known precipitant. He is under the care of dermatologist  and currently being  treated with Tremfya with excellent improvement. History of other significant skin problems: no. Family History of skin disease: no.    Patient Active Problem List   Diagnosis Date Noted   Encounter for general adult medical examination with abnormal findings 02/08/2023   Routine medical exam 02/08/2023   Family history of diabetes mellitus in grandfather 02/08/2023   Primary hypertension 02/08/2023   Class 1 obesity due to excess calories without serious comorbidity with body mass index (BMI) of 30.0 to 30.9 in adult 02/08/2023   Psoriasis 02/08/2023   Rosacea 02/08/2023   Elevated cholesterol 08/21/2020   Annual physical exam 08/18/2020   Essential hypertension 08/18/2020   BMI 28.0-28.9,adult    Past Medical History:  Diagnosis Date   BMI 28.0-28.9,adult    Cancer (HCC) 1995   colon   Psoriasis    Past Surgical  History:  Procedure Laterality Date   COLON SURGERY  1995   colon CA   LEG SURGERY     SHOULDER SURGERY     Social History   Tobacco Use   Smoking status: Never   Smokeless tobacco: Current    Types: Chew  Vaping Use   Vaping status: Never Used  Substance Use Topics   Alcohol use: Yes    Alcohol/week: 25.0 standard drinks of alcohol    Types: 25 Cans of beer per week   Drug use: Not Currently   Social History   Socioeconomic History   Marital status: Single    Spouse name: Not on file   Number of children: Not on file   Years of education: Not  on file   Highest education level: Not on file  Occupational History   Not on file  Tobacco Use   Smoking status: Never   Smokeless tobacco: Current    Types: Chew  Vaping Use   Vaping status: Never Used  Substance and Sexual Activity   Alcohol use: Yes    Alcohol/week: 25.0 standard drinks of alcohol    Types: 25 Cans of beer per week   Drug use: Not Currently   Sexual activity: Not on file  Other Topics Concern   Not on file  Social History Narrative   Not on file   Social Determinants of Health   Financial Resource Strain: Not on file  Food Insecurity: Not on file  Transportation Needs: Not on file  Physical Activity: Not on file  Stress: Not on file  Social Connections: Not on file  Intimate Partner Violence: Not on file   Family Status  Relation Name Status   Mother  (Not Specified)   Father  (Not Specified)  No partnership data on file   Family History  Problem Relation Age of Onset   Hypertension Mother    Hypertension Father    No Known Allergies  Review of Systems  Constitutional:  Negative for chills and fever.  HENT:  Negative for ear pain and sore throat.   Eyes:  Negative for double vision and pain.  Respiratory:  Negative for cough and shortness of breath.   Cardiovascular:  Negative for chest pain and leg swelling.  Gastrointestinal:  Negative for blood in stool, constipation, diarrhea, nausea and vomiting.  Genitourinary:  Negative for frequency and urgency.  Musculoskeletal:  Negative for falls and myalgias.  Skin:  Negative for itching and rash.  Neurological:  Negative for dizziness and weakness.  Endo/Heme/Allergies:  Negative for environmental allergies and polydipsia. Does not bruise/bleed easily.  Psychiatric/Behavioral:  Negative for hallucinations and suicidal ideas.    Negative unless indicated in HPI   Objective:     BP (!) 147/93   Pulse 69   Temp 98.2 F (36.8 C) (Temporal)   Ht 5\' 11"  (1.803 m)   Wt 215 lb 12.8 oz (97.9  kg)   SpO2 94%   BMI 30.10 kg/m  BP Readings from Last 3 Encounters:  02/08/23 (!) 147/93  02/11/21 (!) 152/89  09/03/20 (!) 155/101   Wt Readings from Last 3 Encounters:  02/08/23 215 lb 12.8 oz (97.9 kg)  02/11/21 200 lb (90.7 kg)  09/03/20 203 lb (92.1 kg)        02/08/2023    3:06 PM 02/11/2021   11:30 AM 08/18/2020    3:05 PM  PHQ9 SCORE ONLY  PHQ-9 Total Score 0 0 0     Physical Exam Vitals  and nursing note reviewed.  Constitutional:      General: He is not in acute distress.    Appearance: Normal appearance. He is obese.  HENT:     Head: Normocephalic and atraumatic.  Eyes:     General: No scleral icterus.    Extraocular Movements: Extraocular movements intact.     Conjunctiva/sclera: Conjunctivae normal.     Pupils: Pupils are equal, round, and reactive to light.  Neck:     Vascular: No carotid bruit.  Cardiovascular:     Rate and Rhythm: Normal rate and regular rhythm.  Pulmonary:     Effort: Pulmonary effort is normal. No respiratory distress.     Breath sounds: Normal breath sounds. No wheezing.  Musculoskeletal:        General: No swelling or tenderness. Normal range of motion.     Cervical back: Normal range of motion and neck supple. No rigidity or tenderness.     Right lower leg: No edema.     Left lower leg: No edema.  Lymphadenopathy:     Cervical: No cervical adenopathy.  Skin:    General: Skin is warm and dry.     Findings: No rash.  Neurological:     Mental Status: He is alert. Mental status is at baseline.  Psychiatric:        Mood and Affect: Mood normal.        Behavior: Behavior normal.        Thought Content: Thought content normal.        Judgment: Judgment normal.      No results found for any visits on 02/08/23.  Last CBC Lab Results  Component Value Date   WBC 3.4 02/11/2021   HGB 16.3 02/11/2021   HCT 46.9 02/11/2021   MCV 93 02/11/2021   MCH 32.3 02/11/2021   RDW 12.8 02/11/2021   PLT 189 02/11/2021   Last  metabolic panel Lab Results  Component Value Date   GLUCOSE 91 02/11/2021   NA 139 02/11/2021   K 4.5 02/11/2021   CL 102 02/11/2021   CO2 21 02/11/2021   BUN 11 02/11/2021   CREATININE 0.96 02/11/2021   EGFR 96 02/11/2021   CALCIUM 8.9 02/11/2021   PROT 7.1 02/11/2021   ALBUMIN 4.7 02/11/2021   LABGLOB 2.4 02/11/2021   AGRATIO 2.0 02/11/2021   BILITOT 1.0 02/11/2021   ALKPHOS 59 02/11/2021   AST 26 02/11/2021   ALT 35 02/11/2021   Last lipids Lab Results  Component Value Date   CHOL 261 (H) 02/11/2021   HDL 61 02/11/2021   LDLCALC 165 (H) 02/11/2021   TRIG 194 (H) 02/11/2021   CHOLHDL 4.3 02/11/2021   Last hemoglobin A1c No results found for: "HGBA1C" Last thyroid functions Lab Results  Component Value Date   TSH 1.090 08/18/2020        Assessment & Plan:  Encounter for general adult medical examination with abnormal findings -     CBC with Differential/Platelet -     CMP14+EGFR -     Lipid panel -     PSA, total and free -     Thyroid Panel With TSH -     Lisinopril; Take 1 tablet (10 mg total) by mouth daily.  Dispense: 90 tablet; Refill: 3  Primary hypertension -     CMP14+EGFR  Psoriasis -     CMP14+EGFR  Rosacea -     CMP14+EGFR  Class 1 obesity due to excess calories without serious comorbidity with  body mass index (BMI) of 30.0 to 30.9 in adult  Family history of diabetes mellitus in grandfather  Amato is a 36 yrs old male, no acute distress HTN: restart Lisinopril 10 mg daily, BP log sheet provided to client he is instructed to check BP BID and to bring it the log back t his next appointment  Dyslipidemia: restart Fish and wait on Labs to restart med. Labs: CBC, CMP, Lipid, PSA, TSH Continue taking all prescribed medications -He was educated on medications adherence -Dash Diet/Low sodium diet for HTN  Encourage healthy lifestyle choices, including diet (rich in fruits, vegetables, and lean proteins, and low in salt and simple  carbohydrates) and exercise (at least 30 minutes of moderate physical activity daily).     The above assessment and management plan was discussed with the patient. The patient verbalized understanding of and has agreed to the management plan. Patient is aware to call the clinic if they develop any new symptoms or if symptoms persist or worsen. Patient is aware when to return to the clinic for a follow-up visit. Patient educated on when it is appropriate to go to the emergency department.   Return in about 1 month (around 03/10/2023) for follow-up for HTN.   Arrie Aran Santa Lighter, DNP [facility] [address] [place], [place] [address] [phone]

## 2023-02-09 LAB — CMP14+EGFR
ALT: 42 IU/L (ref 0–44)
AST: 31 IU/L (ref 0–40)
Albumin: 4.6 g/dL (ref 3.8–4.9)
Alkaline Phosphatase: 57 IU/L (ref 44–121)
BUN/Creatinine Ratio: 14 (ref 9–20)
BUN: 13 mg/dL (ref 6–24)
Bilirubin Total: 0.8 mg/dL (ref 0.0–1.2)
CO2: 23 mmol/L (ref 20–29)
Calcium: 9.1 mg/dL (ref 8.7–10.2)
Chloride: 100 mmol/L (ref 96–106)
Creatinine, Ser: 0.91 mg/dL (ref 0.76–1.27)
Globulin, Total: 2.6 g/dL (ref 1.5–4.5)
Glucose: 112 mg/dL — ABNORMAL HIGH (ref 70–99)
Potassium: 4.1 mmol/L (ref 3.5–5.2)
Sodium: 136 mmol/L (ref 134–144)
Total Protein: 7.2 g/dL (ref 6.0–8.5)
eGFR: 101 mL/min/{1.73_m2} (ref >59)

## 2023-02-09 LAB — CBC WITH DIFFERENTIAL/PLATELET
Basophils Absolute: 0.1 10*3/uL (ref 0.0–0.2)
Basos: 1 %
EOS (ABSOLUTE): 0.1 10*3/uL (ref 0.0–0.4)
Eos: 3 %
Hematocrit: 44.2 % (ref 37.5–51.0)
Hemoglobin: 15.2 g/dL (ref 13.0–17.7)
Immature Grans (Abs): 0 10*3/uL (ref 0.0–0.1)
Immature Granulocytes: 0 %
Lymphocytes Absolute: 1.4 10*3/uL (ref 0.7–3.1)
Lymphs: 33 %
MCH: 31.6 pg (ref 26.6–33.0)
MCHC: 34.4 g/dL (ref 31.5–35.7)
MCV: 92 fL (ref 79–97)
Monocytes Absolute: 0.5 10*3/uL (ref 0.1–0.9)
Monocytes: 11 %
Neutrophils Absolute: 2.2 10*3/uL (ref 1.4–7.0)
Neutrophils: 52 %
Platelets: 183 10*3/uL (ref 150–450)
RBC: 4.81 x10E6/uL (ref 4.14–5.80)
RDW: 12.5 % (ref 11.6–15.4)
WBC: 4.3 10*3/uL (ref 3.4–10.8)

## 2023-02-09 LAB — LIPID PANEL
Chol/HDL Ratio: 4.4 ratio (ref 0.0–5.0)
Cholesterol, Total: 255 mg/dL — ABNORMAL HIGH (ref 100–199)
HDL: 58 mg/dL (ref >39)
LDL Chol Calc (NIH): 163 mg/dL — ABNORMAL HIGH (ref 0–99)
Triglycerides: 185 mg/dL — ABNORMAL HIGH (ref 0–149)
VLDL Cholesterol Cal: 34 mg/dL (ref 5–40)

## 2023-02-09 LAB — THYROID PANEL WITH TSH
Free Thyroxine Index: 2 (ref 1.2–4.9)
T3 Uptake Ratio: 28 % (ref 24–39)
T4, Total: 7 ug/dL (ref 4.5–12.0)
TSH: 1.28 u[IU]/mL (ref 0.450–4.500)

## 2023-02-09 LAB — PSA, TOTAL AND FREE
PSA, Free Pct: 24.4 %
PSA, Free: 0.22 ng/mL
Prostate Specific Ag, Serum: 0.9 ng/mL (ref 0.0–4.0)

## 2023-03-10 ENCOUNTER — Ambulatory Visit: Payer: Commercial Managed Care - PPO | Admitting: Nurse Practitioner

## 2023-04-05 NOTE — Progress Notes (Deleted)
Established Patient Office Visit  Subjective   Patient ID: Mario Bradley, male    DOB: 1970-10-28  Age: 52 y.o. MRN: [id number]  No chief complaint on file.   HPI  Patient Active Problem List   Diagnosis Date Noted   Encounter for general adult medical examination with abnormal findings 02/08/2023   Routine medical exam 02/08/2023   Family history of diabetes mellitus in grandfather 02/08/2023   Primary hypertension 02/08/2023   Class 1 obesity due to excess calories without serious comorbidity with body mass index (BMI) of 30.0 to 30.9 in adult 02/08/2023   Psoriasis 02/08/2023   Rosacea 02/08/2023   Elevated cholesterol 08/21/2020   Annual physical exam 08/18/2020   Essential hypertension 08/18/2020   BMI 28.0-28.9,adult    Past Medical History:  Diagnosis Date   BMI 28.0-28.9,adult    Cancer (HCC) 1995   colon   Psoriasis    Past Surgical History:  Procedure Laterality Date   COLON SURGERY  1995   colon CA   LEG SURGERY     SHOULDER SURGERY     Social History   Tobacco Use   Smoking status: Never   Smokeless tobacco: Current    Types: Chew  Vaping Use   Vaping status: Never Used  Substance Use Topics   Alcohol use: Yes    Alcohol/week: 25.0 standard drinks of alcohol    Types: 25 Cans of beer per week   Drug use: Not Currently   Social History   Socioeconomic History   Marital status: Single    Spouse name: Not on file   Number of children: Not on file   Years of education: Not on file   Highest education level: Not on file  Occupational History   Not on file  Tobacco Use   Smoking status: Never   Smokeless tobacco: Current    Types: Chew  Vaping Use   Vaping status: Never Used  Substance and Sexual Activity   Alcohol use: Yes    Alcohol/week: 25.0 standard drinks of alcohol    Types: 25 Cans of beer per week   Drug use: Not Currently   Sexual activity: Not on file  Other Topics Concern   Not on file  Social History Narrative    Not on file   Social Determinants of Health   Financial Resource Strain: Not on file  Food Insecurity: Not on file  Transportation Needs: Not on file  Physical Activity: Not on file  Stress: Not on file  Social Connections: Not on file  Intimate Partner Violence: Not on file   Family Status  Relation Name Status   Mother  (Not Specified)   Father  (Not Specified)  No partnership data on file   Family History  Problem Relation Age of Onset   Hypertension Mother    Hypertension Father    No Known Allergies    ROS Negative unless indicated in HPI   Objective:     There were no vitals taken for this visit. BP Readings from Last 3 Encounters:  02/08/23 (!) 147/93  02/11/21 (!) 152/89  09/03/20 (!) 155/101   Wt Readings from Last 3 Encounters:  02/08/23 215 lb 12.8 oz (97.9 kg)  02/11/21 200 lb (90.7 kg)  09/03/20 203 lb (92.1 kg)      Physical Exam   No results found for any visits on 04/06/23.  Last CBC Lab Results  Component Value Date   WBC 4.3 02/08/2023   HGB 15.2  02/08/2023   HCT 44.2 02/08/2023   MCV 92 02/08/2023   MCH 31.6 02/08/2023   RDW 12.5 02/08/2023   PLT 183 02/08/2023   Last metabolic panel Lab Results  Component Value Date   GLUCOSE 112 (H) 02/08/2023   NA 136 02/08/2023   K 4.1 02/08/2023   CL 100 02/08/2023   CO2 23 02/08/2023   BUN 13 02/08/2023   CREATININE 0.91 02/08/2023   EGFR 101 02/08/2023   CALCIUM 9.1 02/08/2023   PROT 7.2 02/08/2023   ALBUMIN 4.6 02/08/2023   LABGLOB 2.6 02/08/2023   AGRATIO 2.0 02/11/2021   BILITOT 0.8 02/08/2023   ALKPHOS 57 02/08/2023   AST 31 02/08/2023   ALT 42 02/08/2023   Last lipids Lab Results  Component Value Date   CHOL 255 (H) 02/08/2023   HDL 58 02/08/2023   LDLCALC 163 (H) 02/08/2023   TRIG 185 (H) 02/08/2023   CHOLHDL 4.4 02/08/2023   Last hemoglobin A1c No results found for: "HGBA1C" Last thyroid functions Lab Results  Component Value Date   TSH 1.280 02/08/2023    T4TOTAL 7.0 02/08/2023        Assessment & Plan:  There are no diagnoses linked to this encounter. Continue healthy lifestyle choices, including diet (rich in fruits, vegetables, and lean proteins, and low in salt and simple carbohydrates) and exercise (at least 30 minutes of moderate physical activity daily).     The above assessment and management plan was discussed with the patient. The patient verbalized understanding of and has agreed to the management plan. Patient is aware to call the clinic if they develop any new symptoms or if symptoms persist or worsen. Patient is aware when to return to the clinic for a follow-up visit. Patient educated on when it is appropriate to go to the emergency department.  No follow-ups on file.    Arrie Aran Santa Lighter, DNP [facility] [address] [place], [place] [address] [phone]

## 2023-04-06 ENCOUNTER — Ambulatory Visit: Payer: Commercial Managed Care - PPO | Admitting: Nurse Practitioner

## 2023-04-18 ENCOUNTER — Ambulatory Visit: Payer: Commercial Managed Care - PPO | Admitting: Nurse Practitioner

## 2023-04-19 ENCOUNTER — Telehealth: Payer: Self-pay

## 2023-04-19 ENCOUNTER — Ambulatory Visit: Payer: Commercial Managed Care - PPO | Admitting: Nurse Practitioner

## 2023-04-19 NOTE — Telephone Encounter (Signed)
Lmtcb to resch appt due to provider being out sick

## 2023-04-28 ENCOUNTER — Ambulatory Visit: Payer: Commercial Managed Care - PPO | Admitting: Nurse Practitioner

## 2023-05-03 ENCOUNTER — Telehealth: Payer: Self-pay

## 2023-05-03 ENCOUNTER — Telehealth: Payer: Self-pay | Admitting: Nurse Practitioner

## 2023-05-03 NOTE — Telephone Encounter (Signed)
Patient advised medication was refilled on 02/08/23 #90, with 3 refills to [facility]

## 2023-05-03 NOTE — Telephone Encounter (Signed)
  Prescription Request  05/03/2023  Is this a "Controlled Substance" medicine? no  Have you seen your PCP in the last 2 weeks? Pt changed appt from 12/18 to jan 2 because he didn't want to do video visit  If YES, route message to pool  -  If NO, patient needs to be scheduled for appointment.  What is the name of the medication or equipment? lisinopril  Have you contacted your pharmacy to request a refill? yes  [facility]   Patient notified that their request is being sent to the clinical staff for review and that they should receive a response within 2 business days.

## 2023-05-03 NOTE — Telephone Encounter (Signed)
Copied from CRM [id number]. Topic: General - Other >> May 03, 2023  1:08 PM Sasha H wrote: Reason for CRM: pt does not want to do video visit for 12/18, he prefers to come into office

## 2023-05-03 NOTE — Telephone Encounter (Signed)
Copied from CRM [id number]. Topic: Clinical - Medication Refill >> May 03, 2023  1:14 PM Sasha H wrote: Most Recent Primary Care Visit:  Provider: ST Santa Lighter, [place]  Department: [facility]  Visit Type: OFFICE VISIT  Date: 02/08/2023  Medication: lisinopril (ZESTRIL) 10 MG tablet  Has the patient contacted their pharmacy? Yes (Agent: If no, request that the patient contact the pharmacy for the refill. If patient does not wish to contact the pharmacy document the reason why and proceed with request.) (Agent: If yes, when and what did the pharmacy advise?)  Is this the correct pharmacy for this prescription? Yes If no, delete pharmacy and type the correct one.  This is the patient's preferred pharmacy:  [id number] [place], [place] - [address] [place] [address] Phone: [phone] Fax: [phone]  [facility] - [place], [place] - [address] [facility] [address] [place] [address] Phone: [phone] Fax: [phone]  [facility] - [place], [place] - [address] [place] [address] Phone: [phone] Fax: [phone]   Has the prescription been filled recently? Yes  Is the patient out of the medication? Yes  Has the patient been seen for an appointment in the last year OR does the patient have an upcoming appointment? Yes  Can we respond through MyChart? Yes  Agent: Please be advised that Rx refills may take up to 3 business days. We ask that you follow-up with your pharmacy.

## 2023-05-03 NOTE — Telephone Encounter (Signed)
Pt scheduled his appt on mychart

## 2023-05-03 NOTE — Telephone Encounter (Signed)
Duplicate encounter, [facility], there should be refills on file

## 2023-05-04 ENCOUNTER — Telehealth: Payer: Commercial Managed Care - PPO | Admitting: Nurse Practitioner

## 2023-05-12 NOTE — Progress Notes (Signed)
 Established Patient Office Visit  Subjective  Patient ID: Mario Bradley, male    DOB: June 28, 1970  Age: 52 y.o. MRN: [id number]  Chief Complaint  Patient presents with   Medical Management of Chronic Issues    HPI Mario Bradley is 52 yrs old male presents 05/19/2023 for 22-months follow up for chronic diseases management. Concerns for congestion for 1-week that is getting better with Flonase  Nasal congestion New Reports developed nasal congestion 2 weeks ago that is not getting with better with Flonase. Symptoms include stuffy nose, nasal discharge He denies fever, sinus pain, or other systemic symptoms.   Hypertension, follow-up  BP Readings from Last 3 Encounters:  05/19/23 (!) 163/100  02/08/23 (!) 147/93  02/11/21 (!) 152/89   Wt Readings from Last 3 Encounters:  05/19/23 218 lb 12.8 oz (99.2 kg)  02/08/23 215 lb 12.8 oz (97.9 kg)  02/11/21 200 lb (90.7 kg)     He was last seen for hypertension 3 months ago.  BP at that visit was 147/93. Management since that visit includes Lisinopril  20 mg daily.  He reports excellent compliance with treatment. He is not having side effects.  He is following a Regular diet. He is exercising.  Just ran a marathons last month He does smoke. Use of agents associated with hypertension: none.   Outside blood pressures are 130/82 Symptoms: No chest pain No chest pressure  No palpitations No syncope  No dyspnea No orthopnea  No paroxysmal nocturnal dyspnea No lower extremity edema   Pertinent labs Lab Results  Component Value Date   CHOL 255 (H) 02/08/2023   HDL 58 02/08/2023   LDLCALC 163 (H) 02/08/2023   TRIG 185 (H) 02/08/2023   CHOLHDL 4.4 02/08/2023   Lab Results  Component Value Date   NA 136 02/08/2023   K 4.1 02/08/2023   CREATININE 0.91 02/08/2023   EGFR 101 02/08/2023   GLUCOSE 112 (H) 02/08/2023   TSH 1.280 02/08/2023     The 10-year ASCVD risk score (Arnett DK, et al., 2019) is:  10.5%  Lipid/Cholesterol, Follow-up  Last lipid panel Other pertinent labs  Lab Results  Component Value Date   CHOL 255 (H) 02/08/2023   HDL 58 02/08/2023   LDLCALC 163 (H) 02/08/2023   TRIG 185 (H) 02/08/2023   CHOLHDL 4.4 02/08/2023   Lab Results  Component Value Date   ALT 42 02/08/2023   AST 31 02/08/2023   PLT 183 02/08/2023   TSH 1.280 02/08/2023     He was last seen for this 3 months ago.  Management since that visit includes Lipitor 20 mg which he reports that He has not been taking. He reports poor compliance with treatment. He is not having side effects.   Symptoms: No chest pain No chest pressure/discomfort  No dyspnea No lower extremity edema  No numbness or tingling of extremity No orthopnea  No palpitations No paroxysmal nocturnal dyspnea  No speech difficulty No syncope  Current diet: well balanced Current exercise: bicycling, hiking, and running/ jogging The 10-year ASCVD risk score (Arnett DK, et al., 2019) is: 10.5%. Will restart Lipitor at a lower dose, and recheck lipid   Allergic Rhinitis History of allergic rhinitis that is currently well-controlled with Flonase (fluticasone nasal spray). The patient reports experiencing typical symptoms of allergic rhinitis, including sneezing, nasal congestion, and itchy, watery eyes, primarily year around. He has been using Flonase as directed, with good symptom control. The nasal spray is used once daily and the  patient reports minimal symptoms when the medication is adhered to regularly. The patient denies significant issues with nasal discharge, post-nasal drip, or anosmia. There are no reported recent flare-ups of symptoms or episodes of acute sinusitis. He does not report any adverse effects from the medication, such as nasal irritation, dryness, or epistaxis. The patient has not required additional medications or interventions, and there are no plans to adjust the current treatment regimen at this time. There is no  history of Rosacea and psoriasis.  Patient Active Problem List   Diagnosis Date Noted   Nasal congestion 05/19/2023   Allergic rhinitis 05/19/2023   History of malignant neoplasm of colon 05/19/2023   ED (erectile dysfunction) of organic origin 05/19/2023   Hyperlipidemia 05/19/2023   Encounter for general adult medical examination with abnormal findings 02/08/2023   Routine medical exam 02/08/2023   Family history of diabetes mellitus in grandfather 02/08/2023   Primary hypertension 02/08/2023   Class 1 obesity due to excess calories without serious comorbidity with body mass index (BMI) of 30.0 to 30.9 in adult 02/08/2023   Psoriasis 02/08/2023   Rosacea 02/08/2023   Elevated cholesterol 08/21/2020   Annual physical exam 08/18/2020   Essential hypertension 08/18/2020   BMI 28.0-28.9,adult    Past Medical History:  Diagnosis Date   BMI 28.0-28.9,adult    Cancer (HCC) 1995   colon   Psoriasis    Past Surgical History:  Procedure Laterality Date   COLON SURGERY  1995   colon CA   LEG SURGERY     SHOULDER SURGERY     Social History   Tobacco Use   Smoking status: Never   Smokeless tobacco: Current    Types: Chew  Vaping Use   Vaping status: Never Used  Substance Use Topics   Alcohol use: Yes    Alcohol/week: 25.0 standard drinks of alcohol    Types: 25 Cans of beer per week   Drug use: Not Currently   Social History   Socioeconomic History   Marital status: Single    Spouse name: Not on file   Number of children: Not on file   Years of education: Not on file   Highest education level: Not on file  Occupational History   Not on file  Tobacco Use   Smoking status: Never   Smokeless tobacco: Current    Types: Chew  Vaping Use   Vaping status: Never Used  Substance and Sexual Activity   Alcohol use: Yes    Alcohol/week: 25.0 standard drinks of alcohol    Types: 25 Cans of beer per week   Drug use: Not Currently   Sexual activity: Not on file  Other  Topics Concern   Not on file  Social History Narrative   Not on file   Social Drivers of Health   Financial Resource Strain: Not on file  Food Insecurity: Not on file  Transportation Needs: Not on file  Physical Activity: Not on file  Stress: Not on file  Social Connections: Not on file  Intimate Partner Violence: Not on file   Family Status  Relation Name Status   Mother  (Not Specified)   Father  (Not Specified)  No partnership data on file   Family History  Problem Relation Age of Onset   Hypertension Mother    Hypertension Father    No Known Allergies    Review of Systems  Constitutional:  Negative for chills and fever.  HENT:  Positive for congestion and sinus pain.  Negative for ear pain and sore throat.   Respiratory:  Negative for cough, shortness of breath and wheezing.   Cardiovascular:  Negative for chest pain and leg swelling.  Gastrointestinal:  Negative for constipation, diarrhea, nausea and vomiting.  Genitourinary:  Negative for hematuria and urgency.  Musculoskeletal:  Negative for falls and myalgias.  Skin:  Negative for itching and rash.  Neurological:  Negative for dizziness and headaches.  Endo/Heme/Allergies:  Negative for environmental allergies and polydipsia. Does not bruise/bleed easily.  Psychiatric/Behavioral:  Negative for hallucinations and suicidal ideas. The patient does not have insomnia.    Negative unless indicated in HPI   Objective:     BP (!) 163/100   Pulse 83   Temp 97.6 F (36.4 C) (Temporal)   Ht 5' 11 (1.803 m)   Wt 218 lb 12.8 oz (99.2 kg)   SpO2 97%   BMI 30.52 kg/m  BP Readings from Last 3 Encounters:  05/19/23 (!) 163/100  02/08/23 (!) 147/93  02/11/21 (!) 152/89   Wt Readings from Last 3 Encounters:  05/19/23 218 lb 12.8 oz (99.2 kg)  02/08/23 215 lb 12.8 oz (97.9 kg)  02/11/21 200 lb (90.7 kg)      Physical Exam Vitals and nursing note reviewed.  Constitutional:      Appearance: Normal appearance.   HENT:     Head: Normocephalic and atraumatic.     Right Ear: Tympanic membrane, ear canal and external ear normal. There is no impacted cerumen.     Left Ear: Tympanic membrane, ear canal and external ear normal. There is no impacted cerumen.     Nose: Congestion present.     Right Sinus: Frontal sinus tenderness present.     Left Sinus: Frontal sinus tenderness present.     Mouth/Throat:     Mouth: Mucous membranes are moist.  Eyes:     General: No scleral icterus.    Extraocular Movements: Extraocular movements intact.     Conjunctiva/sclera: Conjunctivae normal.     Pupils: Pupils are equal, round, and reactive to light.  Cardiovascular:     Rate and Rhythm: Normal rate and regular rhythm.  Pulmonary:     Effort: Pulmonary effort is normal.     Breath sounds: Normal breath sounds. No wheezing or rhonchi.  Abdominal:     General: Bowel sounds are normal.     Palpations: Abdomen is soft.  Musculoskeletal:        General: Normal range of motion.     Right lower leg: No edema.     Left lower leg: No edema.  Skin:    General: Skin is warm and dry.     Findings: No rash.  Neurological:     Mental Status: He is alert and oriented to person, place, and time.  Psychiatric:        Mood and Affect: Mood normal.        Behavior: Behavior normal.        Thought Content: Thought content normal.        Judgment: Judgment normal.    No results found for any visits on 05/19/23.  Last CBC Lab Results  Component Value Date   WBC 4.3 02/08/2023   HGB 15.2 02/08/2023   HCT 44.2 02/08/2023   MCV 92 02/08/2023   MCH 31.6 02/08/2023   RDW 12.5 02/08/2023   PLT 183 02/08/2023   Last metabolic panel Lab Results  Component Value Date   GLUCOSE 112 (H) 02/08/2023   NA  136 02/08/2023   K 4.1 02/08/2023   CL 100 02/08/2023   CO2 23 02/08/2023   BUN 13 02/08/2023   CREATININE 0.91 02/08/2023   EGFR 101 02/08/2023   CALCIUM  9.1 02/08/2023   PROT 7.2 02/08/2023   ALBUMIN 4.6  02/08/2023   LABGLOB 2.6 02/08/2023   AGRATIO 2.0 02/11/2021   BILITOT 0.8 02/08/2023   ALKPHOS 57 02/08/2023   AST 31 02/08/2023   ALT 42 02/08/2023   Last lipids Lab Results  Component Value Date   CHOL 255 (H) 02/08/2023   HDL 58 02/08/2023   LDLCALC 163 (H) 02/08/2023   TRIG 185 (H) 02/08/2023   CHOLHDL 4.4 02/08/2023   Last hemoglobin A1c No results found for: HGBA1C Last thyroid  functions Lab Results  Component Value Date   TSH 1.280 02/08/2023   T4TOTAL 7.0 02/08/2023         Assessment & Plan:  Primary hypertension -     Losartan  Potassium-HCTZ; Take 1 tablet by mouth daily.  Dispense: 90 tablet; Refill: 0  Nasal congestion -     Azelastine  HCl; Place 1 spray into both nostrils 2 (two) times daily. Use in each nostril as directed  Dispense: 30 mL; Refill: 12  Elevated cholesterol -     Atorvastatin  Calcium ; Take 1 tablet (10 mg total) by mouth daily.  Dispense: 90 tablet; Refill: 0 -     Lipid panel  Seasonal allergic rhinitis, unspecified trigger   Euan is 52 yrs old Caucasian male, no acute distress Nasal Congestion:Azelastine  BID   Allergic Rhinitis:  continue Flonase daily 2. Use a cool mist humidifier especially during the winter months and when heat has been humid. 3. Use saline nose sprays frequently 4. Saline irrigations of the nose can be very helpful if done frequently.  * 4X daily for 1 week*  * Use of a nettie pot can be helpful with this. Follow directions with this* 5. Drink plenty of fluids 6. Keep thermostat turn down low 7.For any cough or congestion-Coricidin 8. For fever or aces or pains- take tylenol or ibuprofen appropriate for age and weight.  * for fevers greater than 101 orally you may alternate ibuprofen and tylenol every  3 hours.    HTN BP not well controlled. D/c Lisinopril  and will start Hyzaar 50-12.5 mg daily  1-week Nurse visit for BP check Goal BP is 130/80. Pt aware to report any persistent high or low  readings. DASH diet and exercise encouraged. Exercise at least 150 minutes per week and increase as tolerated. Goal BMI > 25. Stress management encouraged. Avoid nicotine and tobacco product use. Avoid excessive alcohol and NSAID's. Avoid more than 2000 mg of sodium daily. Medications as prescribed. Follow up as scheduled.  Hyperlipidemia: recheck Lipid and restart Lipitor 10 mg daily    Continue healthy lifestyle choices, including diet (rich in fruits, vegetables, and lean proteins, and low in salt and simple carbohydrates) and exercise (at least 30 minutes of moderate physical activity daily).     The above assessment and management plan was discussed with the patient. The patient verbalized understanding of and has agreed to the management plan. Patient is aware to call the clinic if they develop any new symptoms or if symptoms persist or worsen. Patient is aware when to return to the clinic for a follow-up visit. Patient educated on when it is appropriate to go to the emergency department.  Return in about 3 months (around 08/17/2023) for follow-up.    Nena Deitra Saltness  Sebastian, DNP [facility] Medicine [address] [place], [place] [address] [phone]    Note: This document was prepared by [employer] voice dictation technology and any errors that results from this process are unintentional.

## 2023-05-19 ENCOUNTER — Ambulatory Visit: Payer: Commercial Managed Care - PPO | Admitting: Nurse Practitioner

## 2023-05-19 ENCOUNTER — Encounter: Payer: Self-pay | Admitting: Nurse Practitioner

## 2023-05-19 VITALS — BP 163/100 | HR 83 | Temp 97.6°F | Ht 71.0 in | Wt 218.8 lb

## 2023-05-19 DIAGNOSIS — R0981 Nasal congestion: Secondary | ICD-10-CM | POA: Diagnosis not present

## 2023-05-19 DIAGNOSIS — E7800 Pure hypercholesterolemia, unspecified: Secondary | ICD-10-CM

## 2023-05-19 DIAGNOSIS — J302 Other seasonal allergic rhinitis: Secondary | ICD-10-CM | POA: Diagnosis not present

## 2023-05-19 DIAGNOSIS — I10 Essential (primary) hypertension: Secondary | ICD-10-CM

## 2023-05-19 DIAGNOSIS — E785 Hyperlipidemia, unspecified: Secondary | ICD-10-CM | POA: Insufficient documentation

## 2023-05-19 DIAGNOSIS — J309 Allergic rhinitis, unspecified: Secondary | ICD-10-CM | POA: Insufficient documentation

## 2023-05-19 DIAGNOSIS — N529 Male erectile dysfunction, unspecified: Secondary | ICD-10-CM | POA: Insufficient documentation

## 2023-05-19 DIAGNOSIS — Z85038 Personal history of other malignant neoplasm of large intestine: Secondary | ICD-10-CM | POA: Insufficient documentation

## 2023-05-19 MED ORDER — AZELASTINE HCL 0.1 % NA SOLN: 1.0000 | Freq: Two times a day (BID) | NASAL | 12 refills | Status: DC

## 2023-05-19 MED ORDER — ATORVASTATIN CALCIUM 10 MG PO TABS
10.0000 mg | ORAL_TABLET | Freq: Every day | ORAL | 0 refills | Status: DC
Start: 2023-05-19 — End: 2023-05-24

## 2023-05-19 MED ORDER — LOSARTAN POTASSIUM-HCTZ 50-12.5 MG PO TABS
1.0000 | ORAL_TABLET | Freq: Every day | ORAL | 0 refills | Status: DC
Start: 2023-05-19 — End: 2023-08-15

## 2023-05-20 LAB — LIPID PANEL
Chol/HDL Ratio: 5.4 {ratio} — ABNORMAL HIGH (ref 0.0–5.0)
Cholesterol, Total: 228 mg/dL — ABNORMAL HIGH (ref 100–199)
HDL: 42 mg/dL (ref >39)
LDL Chol Calc (NIH): 109 mg/dL — ABNORMAL HIGH (ref 0–99)
Triglycerides: 445 mg/dL — ABNORMAL HIGH (ref 0–149)
VLDL Cholesterol Cal: 77 mg/dL — ABNORMAL HIGH (ref 5–40)

## 2023-05-24 ENCOUNTER — Other Ambulatory Visit: Payer: Self-pay | Admitting: Nurse Practitioner

## 2023-05-24 DIAGNOSIS — E782 Mixed hyperlipidemia: Secondary | ICD-10-CM

## 2023-05-24 MED ORDER — ATORVASTATIN CALCIUM 20 MG PO TABS
20.0000 mg | ORAL_TABLET | Freq: Every day | ORAL | 0 refills | Status: DC
Start: 2023-05-24 — End: 2023-10-05

## 2023-05-27 ENCOUNTER — Ambulatory Visit: Payer: Commercial Managed Care - PPO

## 2023-08-14 ENCOUNTER — Other Ambulatory Visit: Payer: Self-pay | Admitting: Nurse Practitioner

## 2023-08-14 DIAGNOSIS — I10 Essential (primary) hypertension: Secondary | ICD-10-CM

## 2023-08-16 NOTE — Progress Notes (Deleted)
 Established Patient Office Visit  Subjective  Patient ID: Mario Bradley, male    DOB: 12/29/1970  Age: 53 y.o. MRN: [id number]  No chief complaint on file.   HPI Hypertension, follow-up  BP Readings from Last 3 Encounters:  05/19/23 (!) 163/100  02/08/23 (!) 147/93  02/11/21 (!) 152/89   Wt Readings from Last 3 Encounters:  05/19/23 218 lb 12.8 oz (99.2 kg)  02/08/23 215 lb 12.8 oz (97.9 kg)  02/11/21 200 lb (90.7 kg)     He was last seen for hypertension {NUMBERS 1-12:18279} {days/wks/mos/yrs:310907} ago.  BP at that visit was ***. Management since that visit includes ***.  He reports {excellent/good/fair/poor:19665} compliance with treatment. He {is/is not:9024} having side effects. {document side effects if present:1} He is following a {diet:21022986} diet. He {is/is not:9024} exercising. He {does/does not:200015} smoke.  Use of agents associated with hypertension: {bp agents assoc with hypertension:511::"none"}.   Outside blood pressures are {***enter patient reported home BP readings, or 'not being checked':1}. Symptoms: {Yes/No:20286} chest pain {Yes/No:20286} chest pressure  {Yes/No:20286} palpitations {Yes/No:20286} syncope  {Yes/No:20286} dyspnea {Yes/No:20286} orthopnea  {Yes/No:20286} paroxysmal nocturnal dyspnea {Yes/No:20286} lower extremity edema   Pertinent labs Lab Results  Component Value Date   CHOL 228 (H) 05/19/2023   HDL 42 05/19/2023   LDLCALC 109 (H) 05/19/2023   TRIG 445 (H) 05/19/2023   CHOLHDL 5.4 (H) 05/19/2023   Lab Results  Component Value Date   NA 136 02/08/2023   K 4.1 02/08/2023   CREATININE 0.91 02/08/2023   EGFR 101 02/08/2023   GLUCOSE 112 (H) 02/08/2023   TSH 1.280 02/08/2023     The 10-year ASCVD risk score (Arnett DK, et al., 2019) is: 12.3%  Lipid/Cholesterol, Follow-up  Last lipid panel Other pertinent labs  Lab Results  Component Value Date   CHOL 228 (H) 05/19/2023   HDL 42 05/19/2023   LDLCALC 109 (H)  05/19/2023   TRIG 445 (H) 05/19/2023   CHOLHDL 5.4 (H) 05/19/2023   Lab Results  Component Value Date   ALT 42 02/08/2023   AST 31 02/08/2023   PLT 183 02/08/2023   TSH 1.280 02/08/2023     He was last seen for this {1-12:18279} {days/wks/mos/yrs:310907} ago.  Management since that visit includes ***.  He reports {excellent/good/fair/poor:19665} compliance with treatment. He {is/is not:9024} having side effects. {document side effects if present:1}  Symptoms: {Yes/No:20286} chest pain {Yes/No:20286} chest pressure/discomfort  {Yes/No:20286} dyspnea {Yes/No:20286} lower extremity edema  {Yes/No:20286} numbness or tingling of extremity {Yes/No:20286} orthopnea  {Yes/No:20286} palpitations {Yes/No:20286} paroxysmal nocturnal dyspnea  {Yes/No:20286} speech difficulty {Yes/No:20286} syncope   Current diet: {diet habits:16563} Current exercise: {exercise types:16438}  The 10-year ASCVD risk score (Arnett DK, et al., 2019) is: 12.3%  Allergic Rhinitis: KUTLER VANVRANKEN is here for evaluation of possible allergic {rhinitis/ conjuntivitis:18878}. Patient's symptoms include {symptoms:18803}. These symptoms are {seasonality:18804}. Current triggers include exposure to {rhinitis triggers:14020}. The patient has been suffering from these symptoms for approximately {numbers 1-12:10294} {day/week/month:11}. The patient has tried {rhinitis treatments:18805} with {good/fair/poor:33178} relief of symptoms. Immunotherapy {immunotherapy:18806}. {nasal polyps:18809} {asthma history:18810} {sinusitis history:18811} The patient {has/has not:18111} had sinus surgery in the past. {eczema history:18812}   Psoriasis: Patient complains of psoriasis.  Symptoms have been ongoing for about {0-10:33138} {time units:11} and have been {clinical course - history:17::"unchanged"}. The patient reports symptoms of {psoriasis sx:13167}, primarily affecting the {psoriasis location:13168}. Lesions appear to be exacerbated  by {psoriasis exacer:13170}. Treatments tried so far include {psoriasis past tx:13171}, with {good/fair/poor/excellent:33178} improvement. History of other significant skin  problems: {yes***/no:17258}. Family History of skin disease: {yes***/no:17258}.   Patient Active Problem List   Diagnosis Date Noted   Nasal congestion 05/19/2023   Allergic rhinitis 05/19/2023   History of malignant neoplasm of colon 05/19/2023   ED (erectile dysfunction) of organic origin 05/19/2023   Hyperlipidemia 05/19/2023   Encounter for general adult medical examination with abnormal findings 02/08/2023   Routine medical exam 02/08/2023   Family history of diabetes mellitus in grandfather 02/08/2023   Primary hypertension 02/08/2023   Class 1 obesity due to excess calories without serious comorbidity with body mass index (BMI) of 30.0 to 30.9 in adult 02/08/2023   Psoriasis 02/08/2023   Rosacea 02/08/2023   Elevated cholesterol 08/21/2020   Annual physical exam 08/18/2020   Essential hypertension 08/18/2020   BMI 28.0-28.9,adult    Past Medical History:  Diagnosis Date   BMI 28.0-28.9,adult    Cancer (HCC) 1995   colon   Psoriasis    Past Surgical History:  Procedure Laterality Date   COLON SURGERY  1995   colon CA   LEG SURGERY     SHOULDER SURGERY     Social History   Tobacco Use   Smoking status: Never   Smokeless tobacco: Current    Types: Chew  Vaping Use   Vaping status: Never Used  Substance Use Topics   Alcohol use: Yes    Alcohol/week: 25.0 standard drinks of alcohol    Types: 25 Cans of beer per week   Drug use: Not Currently   Social History   Socioeconomic History   Marital status: Single    Spouse name: Not on file   Number of children: Not on file   Years of education: Not on file   Highest education level: Not on file  Occupational History   Not on file  Tobacco Use   Smoking status: Never   Smokeless tobacco: Current    Types: Chew  Vaping Use   Vaping status:  Never Used  Substance and Sexual Activity   Alcohol use: Yes    Alcohol/week: 25.0 standard drinks of alcohol    Types: 25 Cans of beer per week   Drug use: Not Currently   Sexual activity: Not on file  Other Topics Concern   Not on file  Social History Narrative   Not on file   Social Drivers of Health   Financial Resource Strain: Not on file  Food Insecurity: Not on file  Transportation Needs: Not on file  Physical Activity: Not on file  Stress: Not on file  Social Connections: Not on file  Intimate Partner Violence: Not on file   Family Status  Relation Name Status   Mother  (Not Specified)   Father  (Not Specified)  No partnership data on file   Family History  Problem Relation Age of Onset   Hypertension Mother    Hypertension Father    No Known Allergies    ROS Negative unless indicated in HPI   Objective:     There were no vitals taken for this visit. BP Readings from Last 3 Encounters:  05/19/23 (!) 163/100  02/08/23 (!) 147/93  02/11/21 (!) 152/89   Wt Readings from Last 3 Encounters:  05/19/23 218 lb 12.8 oz (99.2 kg)  02/08/23 215 lb 12.8 oz (97.9 kg)  02/11/21 200 lb (90.7 kg)      Physical Exam   No results found for any visits on 08/18/23.  Last CBC Lab Results  Component Value Date  WBC 4.3 02/08/2023   HGB 15.2 02/08/2023   HCT 44.2 02/08/2023   MCV 92 02/08/2023   MCH 31.6 02/08/2023   RDW 12.5 02/08/2023   PLT 183 02/08/2023   Last metabolic panel Lab Results  Component Value Date   GLUCOSE 112 (H) 02/08/2023   NA 136 02/08/2023   K 4.1 02/08/2023   CL 100 02/08/2023   CO2 23 02/08/2023   BUN 13 02/08/2023   CREATININE 0.91 02/08/2023   EGFR 101 02/08/2023   CALCIUM 9.1 02/08/2023   PROT 7.2 02/08/2023   ALBUMIN 4.6 02/08/2023   LABGLOB 2.6 02/08/2023   AGRATIO 2.0 02/11/2021   BILITOT 0.8 02/08/2023   ALKPHOS 57 02/08/2023   AST 31 02/08/2023   ALT 42 02/08/2023   Last lipids Lab Results  Component Value  Date   CHOL 228 (H) 05/19/2023   HDL 42 05/19/2023   LDLCALC 109 (H) 05/19/2023   TRIG 445 (H) 05/19/2023   CHOLHDL 5.4 (H) 05/19/2023   Last hemoglobin A1c No results found for: "HGBA1C" Last thyroid functions Lab Results  Component Value Date   TSH 1.280 02/08/2023   T4TOTAL 7.0 02/08/2023        Assessment & Plan:  There are no diagnoses linked to this encounter. Continue healthy lifestyle choices, including diet (rich in fruits, vegetables, and lean proteins, and low in salt and simple carbohydrates) and exercise (at least 30 minutes of moderate physical activity daily).     The above assessment and management plan was discussed with the patient. The patient verbalized understanding of and has agreed to the management plan. Patient is aware to call the clinic if they develop any new symptoms or if symptoms persist or worsen. Patient is aware when to return to the clinic for a follow-up visit. Patient educated on when it is appropriate to go to the emergency department.  No follow-ups on file.    Arrie Aran Santa Lighter, [place] [facility] [address] [place], [place] [address] [phone]    Note: This document was prepared by [employer] voice dictation technology and any errors that results from this process are unintentional.

## 2023-08-18 ENCOUNTER — Ambulatory Visit: Payer: Commercial Managed Care - PPO | Admitting: Nurse Practitioner

## 2023-08-27 NOTE — Progress Notes (Deleted)
 Established Patient Office Visit  Subjective  Patient ID: Mario Bradley, male    DOB: Apr 14, 1971  Age: 53 y.o. MRN: [id number]  No chief complaint on file.   HPI Hypertension, follow-up  BP Readings from Last 3 Encounters:  05/19/23 (!) 163/100  02/08/23 (!) 147/93  02/11/21 (!) 152/89   Wt Readings from Last 3 Encounters:  05/19/23 218 lb 12.8 oz (99.2 kg)  02/08/23 215 lb 12.8 oz (97.9 kg)  02/11/21 200 lb (90.7 kg)     He was last seen for hypertension {NUMBERS 1-12:18279} {days/wks/mos/yrs:310907} ago.  BP at that visit was ***. Management since that visit includes ***.  He reports {excellent/good/fair/poor:19665} compliance with treatment. He {is/is not:9024} having side effects. {document side effects if present:1} He is following a {diet:21022986} diet. He {is/is not:9024} exercising. He {does/does not:200015} smoke.  Use of agents associated with hypertension: {bp agents assoc with hypertension:511::"none"}.   Outside blood pressures are {***enter patient reported home BP readings, or 'not being checked':1}. Symptoms: {Yes/No:20286} chest pain {Yes/No:20286} chest pressure  {Yes/No:20286} palpitations {Yes/No:20286} syncope  {Yes/No:20286} dyspnea {Yes/No:20286} orthopnea  {Yes/No:20286} paroxysmal nocturnal dyspnea {Yes/No:20286} lower extremity edema   Pertinent labs Lab Results  Component Value Date   CHOL 228 (H) 05/19/2023   HDL 42 05/19/2023   LDLCALC 109 (H) 05/19/2023   TRIG 445 (H) 05/19/2023   CHOLHDL 5.4 (H) 05/19/2023   Lab Results  Component Value Date   NA 136 02/08/2023   K 4.1 02/08/2023   CREATININE 0.91 02/08/2023   EGFR 101 02/08/2023   GLUCOSE 112 (H) 02/08/2023   TSH 1.280 02/08/2023     The 10-year ASCVD risk score (Arnett DK, et al., 2019) is: 12.3%  Lipid/Cholesterol, Follow-up  Last lipid panel Other pertinent labs  Lab Results  Component Value Date   CHOL 228 (H) 05/19/2023   HDL 42 05/19/2023   LDLCALC 109 (H)  05/19/2023   TRIG 445 (H) 05/19/2023   CHOLHDL 5.4 (H) 05/19/2023   Lab Results  Component Value Date   ALT 42 02/08/2023   AST 31 02/08/2023   PLT 183 02/08/2023   TSH 1.280 02/08/2023     He was last seen for this {1-12:18279} {days/wks/mos/yrs:310907} ago.  Management since that visit includes ***.  He reports {excellent/good/fair/poor:19665} compliance with treatment. He {is/is not:9024} having side effects. {document side effects if present:1}  Symptoms: {Yes/No:20286} chest pain {Yes/No:20286} chest pressure/discomfort  {Yes/No:20286} dyspnea {Yes/No:20286} lower extremity edema  {Yes/No:20286} numbness or tingling of extremity {Yes/No:20286} orthopnea  {Yes/No:20286} palpitations {Yes/No:20286} paroxysmal nocturnal dyspnea  {Yes/No:20286} speech difficulty {Yes/No:20286} syncope   Current diet: {diet habits:16563} Current exercise: {exercise types:16438}  The 10-year ASCVD risk score (Arnett DK, et al., 2019) is: 12.3%  Allergic Rhinitis: Mario Bradley is here for evaluation of possible allergic {rhinitis/ conjuntivitis:18878}. Patient's symptoms include {symptoms:18803}. These symptoms are {seasonality:18804}. Current triggers include exposure to {rhinitis triggers:14020}. The patient has been suffering from these symptoms for approximately {numbers 1-12:10294} {day/week/month:11}. The patient has tried {rhinitis treatments:18805} with {good/fair/poor:33178} relief of symptoms. Immunotherapy {immunotherapy:18806}. {nasal polyps:18809} {asthma history:18810} {sinusitis history:18811} The patient {has/has not:18111} had sinus surgery in the past. {eczema history:18812}   Psoriasis: Patient complains of psoriasis.  Symptoms have been ongoing for about {0-10:33138} {time units:11} and have been {clinical course - history:17::"unchanged"}. The patient reports symptoms of {psoriasis sx:13167}, primarily affecting the {psoriasis location:13168}. Lesions appear to be exacerbated  by {psoriasis exacer:13170}. Treatments tried so far include {psoriasis past tx:13171}, with {good/fair/poor/excellent:33178} improvement. History of other significant skin  problems: {yes***/no:17258}. Family History of skin disease: {yes***/no:17258}.   Patient Active Problem List   Diagnosis Date Noted  . Nasal congestion 05/19/2023  . Allergic rhinitis 05/19/2023  . History of malignant neoplasm of colon 05/19/2023  . ED (erectile dysfunction) of organic origin 05/19/2023  . Hyperlipidemia 05/19/2023  . Encounter for general adult medical examination with abnormal findings 02/08/2023  . Routine medical exam 02/08/2023  . Family history of diabetes mellitus in grandfather 02/08/2023  . Primary hypertension 02/08/2023  . Class 1 obesity due to excess calories without serious comorbidity with body mass index (BMI) of 30.0 to 30.9 in adult 02/08/2023  . Psoriasis 02/08/2023  . Rosacea 02/08/2023  . Elevated cholesterol 08/21/2020  . Annual physical exam 08/18/2020  . Essential hypertension 08/18/2020  . BMI 28.0-28.9,adult    Past Medical History:  Diagnosis Date  . BMI 28.0-28.9,adult   . Cancer (HCC) 1995   colon  . Psoriasis    Past Surgical History:  Procedure Laterality Date  . COLON SURGERY  1995   colon CA  . LEG SURGERY    . SHOULDER SURGERY     Social History   Tobacco Use  . Smoking status: Never  . Smokeless tobacco: Current    Types: Chew  Vaping Use  . Vaping status: Never Used  Substance Use Topics  . Alcohol use: Yes    Alcohol/week: 25.0 standard drinks of alcohol    Types: 25 Cans of beer per week  . Drug use: Not Currently   Social History   Socioeconomic History  . Marital status: Single    Spouse name: Not on file  . Number of children: Not on file  . Years of education: Not on file  . Highest education level: Not on file  Occupational History  . Not on file  Tobacco Use  . Smoking status: Never  . Smokeless tobacco: Current    Types:  Chew  Vaping Use  . Vaping status: Never Used  Substance and Sexual Activity  . Alcohol use: Yes    Alcohol/week: 25.0 standard drinks of alcohol    Types: 25 Cans of beer per week  . Drug use: Not Currently  . Sexual activity: Not on file  Other Topics Concern  . Not on file  Social History Narrative  . Not on file   Social Drivers of Health   Financial Resource Strain: Not on file  Food Insecurity: Not on file  Transportation Needs: Not on file  Physical Activity: Not on file  Stress: Not on file  Social Connections: Not on file  Intimate Partner Violence: Not on file   Family Status  Relation Name Status  . Mother  (Not Specified)  . Father  (Not Specified)  No partnership data on file   Family History  Problem Relation Age of Onset  . Hypertension Mother   . Hypertension Father    No Known Allergies    ROS Negative unless indicated in HPI   Objective:     There were no vitals taken for this visit. BP Readings from Last 3 Encounters:  05/19/23 (!) 163/100  02/08/23 (!) 147/93  02/11/21 (!) 152/89   Wt Readings from Last 3 Encounters:  05/19/23 218 lb 12.8 oz (99.2 kg)  02/08/23 215 lb 12.8 oz (97.9 kg)  02/11/21 200 lb (90.7 kg)      Physical Exam   No results found for any visits on 08/31/23.  Last CBC Lab Results  Component Value Date  WBC 4.3 02/08/2023   HGB 15.2 02/08/2023   HCT 44.2 02/08/2023   MCV 92 02/08/2023   MCH 31.6 02/08/2023   RDW 12.5 02/08/2023   PLT 183 02/08/2023   Last metabolic panel Lab Results  Component Value Date   GLUCOSE 112 (H) 02/08/2023   NA 136 02/08/2023   K 4.1 02/08/2023   CL 100 02/08/2023   CO2 23 02/08/2023   BUN 13 02/08/2023   CREATININE 0.91 02/08/2023   EGFR 101 02/08/2023   CALCIUM 9.1 02/08/2023   PROT 7.2 02/08/2023   ALBUMIN 4.6 02/08/2023   LABGLOB 2.6 02/08/2023   AGRATIO 2.0 02/11/2021   BILITOT 0.8 02/08/2023   ALKPHOS 57 02/08/2023   AST 31 02/08/2023   ALT 42 02/08/2023    Last lipids Lab Results  Component Value Date   CHOL 228 (H) 05/19/2023   HDL 42 05/19/2023   LDLCALC 109 (H) 05/19/2023   TRIG 445 (H) 05/19/2023   CHOLHDL 5.4 (H) 05/19/2023   Last hemoglobin A1c No results found for: "HGBA1C" Last thyroid functions Lab Results  Component Value Date   TSH 1.280 02/08/2023   T4TOTAL 7.0 02/08/2023        Assessment & Plan:  There are no diagnoses linked to this encounter. Continue healthy lifestyle choices, including diet (rich in fruits, vegetables, and lean proteins, and low in salt and simple carbohydrates) and exercise (at least 30 minutes of moderate physical activity daily).     The above assessment and management plan was discussed with the patient. The patient verbalized understanding of and has agreed to the management plan. Patient is aware to call the clinic if they develop any new symptoms or if symptoms persist or worsen. Patient is aware when to return to the clinic for a follow-up visit. Patient educated on when it is appropriate to go to the emergency department.  No follow-ups on file.    Mario Wiles [place] Thompson, DNP [facility] [address] [place], [place] [address] [phone]    Note: This document was prepared by [employer] voice dictation technology and any errors that results from this process are unintentional.

## 2023-08-31 ENCOUNTER — Ambulatory Visit: Admitting: Nurse Practitioner

## 2023-10-03 ENCOUNTER — Ambulatory Visit: Admitting: Nurse Practitioner

## 2023-10-05 ENCOUNTER — Other Ambulatory Visit: Payer: Self-pay | Admitting: Nurse Practitioner

## 2023-10-05 ENCOUNTER — Encounter: Payer: Self-pay | Admitting: Nurse Practitioner

## 2023-10-05 DIAGNOSIS — E782 Mixed hyperlipidemia: Secondary | ICD-10-CM

## 2023-10-11 NOTE — Progress Notes (Deleted)
 Established Patient Office Visit  Subjective  Patient ID: Mario Bradley, male    DOB: [birth year]  Age: 53 y.o. MRN: [id number]  No chief complaint on file.   HPI Ronda Kazmi [place] Hypertension, follow-up  BP Readings from Last 3 Encounters:  05/19/23 (!) 163/100  02/08/23 (!) 147/93  02/11/21 (!) 152/89   Wt Readings from Last 3 Encounters:  05/19/23 218 lb 12.8 oz (99.2 kg)  02/08/23 215 lb 12.8 oz (97.9 kg)  02/11/21 200 lb (90.7 kg)     He was last seen for hypertension {NUMBERS 1-12:18279} {days/wks/mos/yrs:310907} ago.  BP at that visit was ***. Management since that visit includes ***.  He reports {excellent/good/fair/poor:19665} compliance with treatment. He {is/is not:9024} having side effects. {document side effects if present:1} He is following a {diet:21022986} diet. He {is/is not:9024} exercising. He {does/does not:200015} smoke.  Use of agents associated with hypertension: {bp agents assoc with hypertension:511::"none"}.   Outside blood pressures are {***enter patient reported home BP readings, or 'not being checked':1}. Symptoms: {Yes/No:20286} chest pain {Yes/No:20286} chest pressure  {Yes/No:20286} palpitations {Yes/No:20286} syncope  {Yes/No:20286} dyspnea {Yes/No:20286} orthopnea  {Yes/No:20286} paroxysmal nocturnal dyspnea {Yes/No:20286} lower extremity edema   Pertinent labs Lab Results  Component Value Date   CHOL 228 (H) 05/19/2023   HDL 42 05/19/2023   LDLCALC 109 (H) 05/19/2023   TRIG 445 (H) 05/19/2023   CHOLHDL 5.4 (H) 05/19/2023   Lab Results  Component Value Date   NA 136 02/08/2023   K 4.1 02/08/2023   CREATININE 0.91 02/08/2023   EGFR 101 02/08/2023   GLUCOSE 112 (H) 02/08/2023   TSH 1.280 02/08/2023     The 10-year ASCVD risk score (Arnett DK, et al., 2019) is: 12.3%  Allergic Rhinitis: DANNER PAULDING is here for evaluation of possible allergic {rhinitis/ conjuntivitis:18878}. Patient's symptoms include  {symptoms:18803}. These symptoms are {seasonality:18804}. Current triggers include exposure to {rhinitis triggers:14020}. The patient has been suffering from these symptoms for approximately {numbers 1-12:10294} {day/week/month:11}. The patient has tried {rhinitis treatments:18805} with {good/fair/poor:33178} relief of symptoms. Immunotherapy {immunotherapy:18806}. {nasal polyps:18809} {asthma history:18810} {sinusitis history:18811} The patient {has/has not:18111} had sinus surgery in the past. {eczema history:18812}   Lipid/Cholesterol, Follow-up  Last lipid panel Other pertinent labs  Lab Results  Component Value Date   CHOL 228 (H) 05/19/2023   HDL 42 05/19/2023   LDLCALC 109 (H) 05/19/2023   TRIG 445 (H) 05/19/2023   CHOLHDL 5.4 (H) 05/19/2023   Lab Results  Component Value Date   ALT 42 02/08/2023   AST 31 02/08/2023   PLT 183 02/08/2023   TSH 1.280 02/08/2023     He was last seen for this {1-12:18279} {days/wks/mos/yrs:310907} ago.  Management since that visit includes ***.  He reports {excellent/good/fair/poor:19665} compliance with treatment. He {is/is not:9024} having side effects. {document side effects if present:1}  Symptoms: {Yes/No:20286} chest pain {Yes/No:20286} chest pressure/discomfort  {Yes/No:20286} dyspnea {Yes/No:20286} lower extremity edema  {Yes/No:20286} numbness or tingling of extremity {Yes/No:20286} orthopnea  {Yes/No:20286} palpitations {Yes/No:20286} paroxysmal nocturnal dyspnea  {Yes/No:20286} speech difficulty {Yes/No:20286} syncope   Current diet: {diet habits:16563} Current exercise: {exercise types:16438}  The 10-year ASCVD risk score (Arnett DK, et al., 2019) is: 12.3%  Psoriasis   Patient Active Problem List   Diagnosis Date Noted   Nasal congestion 05/19/2023   Allergic rhinitis 05/19/2023   History of malignant neoplasm of colon 05/19/2023   ED (erectile dysfunction) of organic origin 05/19/2023   Hyperlipidemia 05/19/2023    Encounter for general adult medical examination with abnormal findings  02/08/2023   Routine medical exam 02/08/2023   Family history of diabetes mellitus in grandfather 02/08/2023   Primary hypertension 02/08/2023   Class 1 obesity due to excess calories without serious comorbidity with body mass index (BMI) of 30.0 to 30.9 in adult 02/08/2023   Psoriasis 02/08/2023   Rosacea 02/08/2023   Elevated cholesterol 08/21/2020   Annual physical exam 08/18/2020   Essential hypertension 08/18/2020   BMI 28.0-28.9,adult    Past Medical History:  Diagnosis Date   BMI 28.0-28.9,adult    Cancer (HCC) 1995   colon   Psoriasis    Past Surgical History:  Procedure Laterality Date   COLON SURGERY  1995   colon CA   LEG SURGERY     SHOULDER SURGERY     Social History   Tobacco Use   Smoking status: Never   Smokeless tobacco: Current    Types: Chew  Vaping Use   Vaping status: Never Used  Substance Use Topics   Alcohol use: Yes    Alcohol/week: 25.0 standard drinks of alcohol    Types: 25 Cans of beer per week   Drug use: Not Currently   Social History   Socioeconomic History   Marital status: Single    Spouse name: Not on file   Number of children: Not on file   Years of education: Not on file   Highest education level: Not on file  Occupational History   Not on file  Tobacco Use   Smoking status: Never   Smokeless tobacco: Current    Types: Chew  Vaping Use   Vaping status: Never Used  Substance and Sexual Activity   Alcohol use: Yes    Alcohol/week: 25.0 standard drinks of alcohol    Types: 25 Cans of beer per week   Drug use: Not Currently   Sexual activity: Not on file  Other Topics Concern   Not on file  Social History Narrative   Not on file   Social Drivers of Health   Financial Resource Strain: Not on file  Food Insecurity: Not on file  Transportation Needs: Not on file  Physical Activity: Not on file  Stress: Not on file  Social Connections: Not on  file  Intimate Partner Violence: Not on file   Family Status  Relation Name Status   Mother  (Not Specified)   Father  (Not Specified)  No partnership data on file   Family History  Problem Relation Age of Onset   Hypertension Mother    Hypertension Father    No Known Allergies    ROS Negative unless indicated in HPI   Objective:     There were no vitals taken for this visit. BP Readings from Last 3 Encounters:  05/19/23 (!) 163/100  02/08/23 (!) 147/93  02/11/21 (!) 152/89   Wt Readings from Last 3 Encounters:  05/19/23 218 lb 12.8 oz (99.2 kg)  02/08/23 215 lb 12.8 oz (97.9 kg)  02/11/21 200 lb (90.7 kg)      Physical Exam   No results found for any visits on 10/12/23.  Last CBC Lab Results  Component Value Date   WBC 4.3 02/08/2023   HGB 15.2 02/08/2023   HCT 44.2 02/08/2023   MCV 92 02/08/2023   MCH 31.6 02/08/2023   RDW 12.5 02/08/2023   PLT 183 02/08/2023   Last metabolic panel Lab Results  Component Value Date   GLUCOSE 112 (H) 02/08/2023   NA 136 02/08/2023   K 4.1 02/08/2023  CL 100 02/08/2023   CO2 23 02/08/2023   BUN 13 02/08/2023   CREATININE 0.91 02/08/2023   EGFR 101 02/08/2023   CALCIUM  9.1 02/08/2023   PROT 7.2 02/08/2023   ALBUMIN 4.6 02/08/2023   LABGLOB 2.6 02/08/2023   AGRATIO 2.0 02/11/2021   BILITOT 0.8 02/08/2023   ALKPHOS 57 02/08/2023   AST 31 02/08/2023   ALT 42 02/08/2023   Last lipids Lab Results  Component Value Date   CHOL 228 (H) 05/19/2023   HDL 42 05/19/2023   LDLCALC 109 (H) 05/19/2023   TRIG 445 (H) 05/19/2023   CHOLHDL 5.4 (H) 05/19/2023   Last hemoglobin A1c No results found for: "HGBA1C" Last thyroid  functions Lab Results  Component Value Date   TSH 1.280 02/08/2023   T4TOTAL 7.0 02/08/2023        Assessment & Plan:  There are no diagnoses linked to this encounter. Continue healthy lifestyle choices, including diet (rich in fruits, vegetables, and lean proteins, and low in salt and  simple carbohydrates) and exercise (at least 30 minutes of moderate physical activity daily).     The above assessment and management plan was discussed with the patient. The patient verbalized understanding of and has agreed to the management plan. Patient is aware to call the clinic if they develop any new symptoms or if symptoms persist or worsen. Patient is aware when to return to the clinic for a follow-up visit. Patient educated on when it is appropriate to go to the emergency department.  No follow-ups on file.    Jamesmichael Shadd [place] Thompson, DNP [facility] [address] [place], [place] [address] [phone]    Note: This document was prepared by [employer] voice dictation technology and any errors that results from this process are unintentional.

## 2023-10-12 ENCOUNTER — Ambulatory Visit: Admitting: Nurse Practitioner

## 2023-10-12 DIAGNOSIS — L409 Psoriasis, unspecified: Secondary | ICD-10-CM

## 2023-10-12 DIAGNOSIS — I10 Essential (primary) hypertension: Secondary | ICD-10-CM

## 2023-10-12 DIAGNOSIS — E782 Mixed hyperlipidemia: Secondary | ICD-10-CM

## 2023-10-12 DIAGNOSIS — J302 Other seasonal allergic rhinitis: Secondary | ICD-10-CM

## 2023-10-13 ENCOUNTER — Ambulatory Visit: Admitting: Nurse Practitioner

## 2023-11-25 ENCOUNTER — Other Ambulatory Visit: Payer: Self-pay | Admitting: Nurse Practitioner

## 2023-11-25 DIAGNOSIS — I10 Essential (primary) hypertension: Secondary | ICD-10-CM

## 2023-12-24 ENCOUNTER — Other Ambulatory Visit: Payer: Self-pay | Admitting: Nurse Practitioner

## 2023-12-24 DIAGNOSIS — I10 Essential (primary) hypertension: Secondary | ICD-10-CM

## 2023-12-26 ENCOUNTER — Encounter: Payer: Self-pay | Admitting: Nurse Practitioner

## 2023-12-26 NOTE — Telephone Encounter (Signed)
Left message to schedule appt and will mail letter

## 2023-12-26 NOTE — Telephone Encounter (Signed)
 Nena pt NTBS 30-d given 11/25/23

## 2024-01-05 NOTE — Progress Notes (Signed)
 Established Patient Office Visit  Subjective  Patient ID: Mario Bradley, male    DOB: 1971/01/03  Age: 53 y.o. MRN: [id number]  Chief Complaint  Patient presents with   Medication Refill    HPI Mario Bradley is a 53 year old male present January 09, 2024 for chronic disease management Lipid/Cholesterol, Follow-up  Last lipid panel Other pertinent labs  Lab Results  Component Value Date   CHOL 228 (H) 05/19/2023   HDL 42 05/19/2023   LDLCALC 109 (H) 05/19/2023   TRIG 445 (H) 05/19/2023   CHOLHDL 5.4 (H) 05/19/2023   Lab Results  Component Value Date   ALT 42 02/08/2023   AST 31 02/08/2023   PLT 183 02/08/2023   TSH 1.280 02/08/2023     He was last seen for this 7 months ago.  Management since that visit includes Lipitor 20 mg daily and fish oil daily.  He reports good compliance with treatment. He is not having side effects.   Symptoms: No chest pain No chest pressure/discomfort  No dyspnea No lower extremity edema  No numbness or tingling of extremity No orthopnea  No palpitations No paroxysmal nocturnal dyspnea  No speech difficulty No syncope   Current diet: on average, 2 meals per day Current exercise: walking  The 10-year ASCVD risk score (Arnett DK, et al., 2019) is: 15.6% Hypertension, follow-up  BP Readings from Last 3 Encounters:  01/09/24 (!) 188/120  05/19/23 (!) 163/100  02/08/23 (!) 147/93   Wt Readings from Last 3 Encounters:  01/09/24 215 lb 6.4 oz (97.7 kg)  05/19/23 218 lb 12.8 oz (99.2 kg)  02/08/23 215 lb 12.8 oz (97.9 kg)     He was last seen for hypertension 7 months ago.  BP at that visit was 163/100. Management since that visit includes Hyzaar 50-12.5 mg daily.manual BP 188/120  He reports excellent compliance with treatment. He is not having side effects.  He is following a Regular diet. He is exercising. He does not smoke. Endorses ETOH daily 3-4 beers Use of agents associated with hypertension: none.   Outside  blood pressures are not being monitored. Symptoms: No chest pain No chest pressure  No palpitations No syncope  No dyspnea No orthopnea  No paroxysmal nocturnal dyspnea No lower extremity edema   Pertinent labs Lab Results  Component Value Date   CHOL 228 (H) 05/19/2023   HDL 42 05/19/2023   LDLCALC 109 (H) 05/19/2023   TRIG 445 (H) 05/19/2023   CHOLHDL 5.4 (H) 05/19/2023   Lab Results  Component Value Date   NA 136 02/08/2023   K 4.1 02/08/2023   CREATININE 0.91 02/08/2023   EGFR 101 02/08/2023   GLUCOSE 112 (H) 02/08/2023   TSH 1.280 02/08/2023     The 10-year ASCVD risk score (Arnett DK, et al., 2019) is: 15.6%     Patient Active Problem List   Diagnosis Date Noted   Erectile dysfunction 01/09/2024   Nasal congestion 05/19/2023   Allergic rhinitis 05/19/2023   History of malignant neoplasm of colon 05/19/2023   ED (erectile dysfunction) of organic origin 05/19/2023   Hyperlipidemia 05/19/2023   Encounter for general adult medical examination with abnormal findings 02/08/2023   Routine medical exam 02/08/2023   Family history of diabetes mellitus in grandfather 02/08/2023   Primary hypertension 02/08/2023   Class 1 obesity due to excess calories without serious comorbidity with body mass index (BMI) of 30.0 to 30.9 in adult 02/08/2023   Psoriasis 02/08/2023   Rosacea 02/08/2023  Elevated cholesterol 08/21/2020   Annual physical exam 08/18/2020   Essential hypertension 08/18/2020   BMI 28.0-28.9,adult    Past Medical History:  Diagnosis Date   BMI 28.0-28.9,adult    Cancer (HCC) 1995   colon   Psoriasis    Past Surgical History:  Procedure Laterality Date   COLON SURGERY  1995   colon CA   LEG SURGERY     SHOULDER SURGERY     Social History   Tobacco Use   Smoking status: Never   Smokeless tobacco: Current    Types: Chew  Vaping Use   Vaping status: Never Used  Substance Use Topics   Alcohol use: Yes    Alcohol/week: 25.0 standard drinks of  alcohol    Types: 25 Cans of beer per week   Drug use: Not Currently   Social History   Socioeconomic History   Marital status: Single    Spouse name: Not on file   Number of children: Not on file   Years of education: Not on file   Highest education level: Not on file  Occupational History   Not on file  Tobacco Use   Smoking status: Never   Smokeless tobacco: Current    Types: Chew  Vaping Use   Vaping status: Never Used  Substance and Sexual Activity   Alcohol use: Yes    Alcohol/week: 25.0 standard drinks of alcohol    Types: 25 Cans of beer per week   Drug use: Not Currently   Sexual activity: Not on file  Other Topics Concern   Not on file  Social History Narrative   Not on file   Social Drivers of Health   Financial Resource Strain: Not on file  Food Insecurity: Not on file  Transportation Needs: Not on file  Physical Activity: Not on file  Stress: Not on file  Social Connections: Not on file  Intimate Partner Violence: Not on file   Family Status  Relation Name Status   Mother  (Not Specified)   Father  (Not Specified)  No partnership data on file   Family History  Problem Relation Age of Onset   Hypertension Mother    Hypertension Father    No Known Allergies    Review of Systems  Constitutional:  Negative for chills and fever.  HENT:  Negative for congestion and sore throat.   Respiratory:  Negative for cough, shortness of breath and wheezing.   Cardiovascular:  Negative for chest pain and leg swelling.  Gastrointestinal:  Negative for abdominal pain, blood in stool, diarrhea, nausea and vomiting.  Neurological:  Negative for dizziness and headaches.   Negative unless indicated in HPI   Objective:     BP (!) 188/120 Comment: manual/right arm  Pulse 80   Temp 97.9 F (36.6 C) (Temporal)   Ht 5' 11 (1.803 m)   Wt 215 lb 6.4 oz (97.7 kg)   SpO2 97%   BMI 30.04 kg/m  BP Readings from Last 3 Encounters:  01/09/24 (!) 188/120   05/19/23 (!) 163/100  02/08/23 (!) 147/93   Wt Readings from Last 3 Encounters:  01/09/24 215 lb 6.4 oz (97.7 kg)  05/19/23 218 lb 12.8 oz (99.2 kg)  02/08/23 215 lb 12.8 oz (97.9 kg)      Physical Exam Vitals and nursing note reviewed.  Constitutional:      General: He is not in acute distress. HENT:     Head: Normocephalic and atraumatic.     Nose: Nose  normal.  Eyes:     General: No scleral icterus.    Extraocular Movements: Extraocular movements intact.     Conjunctiva/sclera: Conjunctivae normal.     Pupils: Pupils are equal, round, and reactive to light.  Cardiovascular:     Heart sounds: Normal heart sounds.  Pulmonary:     Breath sounds: Normal breath sounds.  Abdominal:     General: Bowel sounds are normal.     Palpations: Abdomen is soft.  Musculoskeletal:        General: Normal range of motion.     Right lower leg: No edema.     Left lower leg: No edema.  Skin:    General: Skin is warm and dry.     Findings: No rash.  Neurological:     Mental Status: He is alert and oriented to person, place, and time.     Gait: Gait is intact.  Psychiatric:        Mood and Affect: Mood normal.        Behavior: Behavior normal.        Thought Content: Thought content normal.      No results found for any visits on 01/09/24.  Last CBC Lab Results  Component Value Date   WBC 4.3 02/08/2023   HGB 15.2 02/08/2023   HCT 44.2 02/08/2023   MCV 92 02/08/2023   MCH 31.6 02/08/2023   RDW 12.5 02/08/2023   PLT 183 02/08/2023   Last metabolic panel Lab Results  Component Value Date   GLUCOSE 112 (H) 02/08/2023   NA 136 02/08/2023   K 4.1 02/08/2023   CL 100 02/08/2023   CO2 23 02/08/2023   BUN 13 02/08/2023   CREATININE 0.91 02/08/2023   EGFR 101 02/08/2023   CALCIUM  9.1 02/08/2023   PROT 7.2 02/08/2023   ALBUMIN 4.6 02/08/2023   LABGLOB 2.6 02/08/2023   AGRATIO 2.0 02/11/2021   BILITOT 0.8 02/08/2023   ALKPHOS 57 02/08/2023   AST 31 02/08/2023   ALT 42  02/08/2023   Last lipids Lab Results  Component Value Date   CHOL 228 (H) 05/19/2023   HDL 42 05/19/2023   LDLCALC 109 (H) 05/19/2023   TRIG 445 (H) 05/19/2023   CHOLHDL 5.4 (H) 05/19/2023   Last hemoglobin A1c No results found for: HGBA1C Last thyroid  functions Lab Results  Component Value Date   TSH 1.280 02/08/2023   T4TOTAL 7.0 02/08/2023        Assessment & Plan:  Primary hypertension  Mixed hyperlipidemia -     Atorvastatin  Calcium ; Take 1 tablet (20 mg total) by mouth daily.  Dispense: 90 tablet; Refill: 0  Other orders -     Losartan  Potassium-HCTZ; Take 1 tablet by mouth daily.  Dispense: 30 tablet; Refill: 8   (53 year old Caucasian male seen today for chronic disease management, no acute distress Hyperlipidemia: Continue daily fish oil and Lipitor 20 mg daily refill provided Hypertension:BP not controlled. Changes increase made in regimen hyzaar to 100-25 mg daily.  BP log provided to client is instructed to check his BP twice a day and to bring the log back to his appointment in 2 weeks and also bring BP cuff that he is at home Goal BP is 130/80. Pt aware to report any persistent high or low readings. DASH diet and exercise encouraged. Exercise at least 150 minutes per week and increase as tolerated. Goal BMI > 25. Stress management encouraged. Avoid nicotine and tobacco product use. Avoid excessive alcohol and NSAID's. Avoid  more than 2000 mg of sodium daily. Medications as prescribed. Follow up as scheduled.   The above assessment and management plan was discussed with the patient. The patient verbalized understanding of and has agreed to the management plan. Patient is aware to call the clinic if they develop any new symptoms or if symptoms persist or worsen. Patient is aware when to return to the clinic for a follow-up visit. Patient educated on when it is appropriate to go to the emergency department.   - If no improvement will refer to hypertensive  clinic Return in about 2 weeks (around 01/23/2024) for Hypertension.    Daisee Centner [place], DNP [facility] [address] [place], [place] [address] [phone]    Note: This document was prepared by [employer] voice dictation technology and any errors that results from this process are unintentional.

## 2024-01-09 ENCOUNTER — Ambulatory Visit: Admitting: Nurse Practitioner

## 2024-01-09 ENCOUNTER — Encounter: Payer: Self-pay | Admitting: Nurse Practitioner

## 2024-01-09 ENCOUNTER — Telehealth: Payer: Self-pay | Admitting: Nurse Practitioner

## 2024-01-09 VITALS — BP 188/120 | HR 80 | Temp 97.9°F | Ht 71.0 in | Wt 215.4 lb

## 2024-01-09 DIAGNOSIS — E782 Mixed hyperlipidemia: Secondary | ICD-10-CM

## 2024-01-09 DIAGNOSIS — I10 Essential (primary) hypertension: Secondary | ICD-10-CM | POA: Diagnosis not present

## 2024-01-09 DIAGNOSIS — N529 Male erectile dysfunction, unspecified: Secondary | ICD-10-CM | POA: Insufficient documentation

## 2024-01-09 MED ORDER — ATORVASTATIN CALCIUM 20 MG PO TABS: 20.0000 mg | ORAL_TABLET | Freq: Every day | ORAL | 0 refills | Status: DC

## 2024-01-09 MED ORDER — LOSARTAN POTASSIUM-HCTZ 50-12.5 MG PO TABS: 1.0000 | ORAL_TABLET | Freq: Every day | ORAL | 0 refills | Status: DC

## 2024-01-09 NOTE — Telephone Encounter (Unsigned)
 Copied from CRM #[id number]. Topic: Clinical - Prescription Issue >> Jan 09, 2024  4:03 PM Willma R wrote: Reason for CRM: Patient is at the pharmacy and picked up his prescription for losartan -hydrochlorothiazide (HYZAAR) 50-12.5 MG tablet, states during his visit today NP [facility] advised she was going to up his dosage but its the same prescription that he had.   Patient can be reached at [phone]

## 2024-01-10 ENCOUNTER — Other Ambulatory Visit: Payer: Self-pay | Admitting: Nurse Practitioner

## 2024-01-10 DIAGNOSIS — I10 Essential (primary) hypertension: Secondary | ICD-10-CM

## 2024-01-10 MED ORDER — LOSARTAN POTASSIUM-HCTZ 100-25 MG PO TABS: 1.0000 | ORAL_TABLET | Freq: Every day | ORAL | 0 refills | Status: DC

## 2024-01-10 NOTE — Progress Notes (Signed)
 30 days script for Hyzaar 100-25 mg sent to his pharmacy

## 2024-01-10 NOTE — Telephone Encounter (Signed)
 Pt aware and verbalized understanding.

## 2024-01-23 ENCOUNTER — Ambulatory Visit: Admitting: Nurse Practitioner

## 2024-01-23 ENCOUNTER — Encounter: Payer: Self-pay | Admitting: Nurse Practitioner

## 2024-01-23 DIAGNOSIS — I10 Essential (primary) hypertension: Secondary | ICD-10-CM | POA: Diagnosis not present

## 2024-01-23 DIAGNOSIS — E782 Mixed hyperlipidemia: Secondary | ICD-10-CM | POA: Diagnosis not present

## 2024-01-23 MED ORDER — ATORVASTATIN CALCIUM 20 MG PO TABS: 20.0000 mg | ORAL_TABLET | Freq: Every day | ORAL | 0 refills | Status: DC

## 2024-01-23 MED ORDER — LOSARTAN POTASSIUM-HCTZ 100-25 MG PO TABS: 1.0000 | ORAL_TABLET | Freq: Every day | ORAL | 0 refills | Status: DC

## 2024-01-23 MED ORDER — AMLODIPINE BESYLATE 5 MG PO TABS: 5.0000 mg | ORAL_TABLET | Freq: Every day | ORAL | 0 refills | Status: DC

## 2024-01-23 NOTE — Progress Notes (Signed)
 Established Patient Office Visit  Subjective  Patient ID: Mario Bradley, male    DOB: [birth year]  Age: 53 y.o. MRN: [id number]  Chief Complaint  Patient presents with   Medical Management of Chronic Issues    2 week f/u htn    HPI Mario Bradley 53 year old male present January 23, 2024 for 2 weeks follow-up for uncontrolled hypertension Hypertension, follow-up  BP Readings from Last 3 Encounters:  01/23/24 (!) 150/88  01/09/24 (!) 188/120  05/19/23 (!) 163/100   Wt Readings from Last 3 Encounters:  01/23/24 213 lb 12.8 oz (97 kg)  01/09/24 215 lb 6.4 oz (97.7 kg)  05/19/23 218 lb 12.8 oz (99.2 kg)     He was last seen for hypertension 2 weeks ago.  BP at that visit was 188/120. Management since that visit includes hyzaar 100-12.5 mg  He reports excellent compliance with treatment. He is not having side effects.  He is following a Regular diet. He is exercising. He does not smoke.  Use of agents associated with hypertension: none.   Outside blood pressures are 138/84 average. Symptoms: No chest pain No chest pressure  No palpitations No syncope  No dyspnea No orthopnea  No paroxysmal nocturnal dyspnea No lower extremity edema   Pertinent labs Lab Results  Component Value Date   CHOL 228 (H) 05/19/2023   HDL 42 05/19/2023   LDLCALC 109 (H) 05/19/2023   TRIG 445 (H) 05/19/2023   CHOLHDL 5.4 (H) 05/19/2023   Lab Results  Component Value Date   NA 136 02/08/2023   K 4.1 02/08/2023   CREATININE 0.91 02/08/2023   EGFR 101 02/08/2023   GLUCOSE 112 (H) 02/08/2023   TSH 1.280 02/08/2023     The 10-year ASCVD risk score (Arnett DK, et al., 2019) is: 10.7%   Patient Active Problem List   Diagnosis Date Noted   Erectile dysfunction 01/09/2024   Nasal congestion 05/19/2023   Allergic rhinitis 05/19/2023   History of malignant neoplasm of colon 05/19/2023   ED (erectile dysfunction) of organic origin 05/19/2023   Hyperlipidemia 05/19/2023    Encounter for general adult medical examination with abnormal findings 02/08/2023   Routine medical exam 02/08/2023   Family history of diabetes mellitus in grandfather 02/08/2023   Primary hypertension 02/08/2023   Class 1 obesity due to excess calories without serious comorbidity with body mass index (BMI) of 30.0 to 30.9 in adult 02/08/2023   Psoriasis 02/08/2023   Rosacea 02/08/2023   Elevated cholesterol 08/21/2020   Annual physical exam 08/18/2020   Essential hypertension 08/18/2020   BMI 28.0-28.9,adult    Past Medical History:  Diagnosis Date   BMI 28.0-28.9,adult    Cancer (HCC) 1995   colon   Psoriasis    Past Surgical History:  Procedure Laterality Date   COLON SURGERY  1995   colon CA   LEG SURGERY     SHOULDER SURGERY     Social History   Tobacco Use   Smoking status: Never   Smokeless tobacco: Current    Types: Chew  Vaping Use   Vaping status: Never Used  Substance Use Topics   Alcohol use: Yes    Alcohol/week: 25.0 standard drinks of alcohol    Types: 25 Cans of beer per week   Drug use: Not Currently   Social History   Socioeconomic History   Marital status: Single    Spouse name: Not on file   Number of children: Not on file   Years of  education: Not on file   Highest education level: Not on file  Occupational History   Not on file  Tobacco Use   Smoking status: Never   Smokeless tobacco: Current    Types: Chew  Vaping Use   Vaping status: Never Used  Substance and Sexual Activity   Alcohol use: Yes    Alcohol/week: 25.0 standard drinks of alcohol    Types: 25 Cans of beer per week   Drug use: Not Currently   Sexual activity: Not on file  Other Topics Concern   Not on file  Social History Narrative   Not on file   Social Drivers of Health   Financial Resource Strain: Not on file  Food Insecurity: Not on file  Transportation Needs: Not on file  Physical Activity: Not on file  Stress: Not on file  Social Connections: Not on  file  Intimate Partner Violence: Not on file   Family Status  Relation Name Status   Mother  (Not Specified)   Father  (Not Specified)  No partnership data on file   Family History  Problem Relation Age of Onset   Hypertension Mother    Hypertension Father    No Known Allergies    Review of Systems  Constitutional:  Negative for chills and fever.  HENT:  Negative for congestion and sore throat.   Respiratory:  Negative for cough and shortness of breath.   Cardiovascular:  Negative for chest pain and leg swelling.  Gastrointestinal:  Negative for constipation, nausea and vomiting.  Skin:  Negative for itching and rash.  Neurological:  Negative for dizziness and headaches.   Negative unless indicated in HPI   Objective:     BP (!) 150/88   Pulse 76   Temp 98 F (36.7 C) (Temporal)   Ht 5' 11 (1.803 m)   Wt 213 lb 12.8 oz (97 kg)   SpO2 99%   BMI 29.82 kg/m  BP Readings from Last 3 Encounters:  01/23/24 (!) 150/88  01/09/24 (!) 188/120  05/19/23 (!) 163/100   Wt Readings from Last 3 Encounters:  01/23/24 213 lb 12.8 oz (97 kg)  01/09/24 215 lb 6.4 oz (97.7 kg)  05/19/23 218 lb 12.8 oz (99.2 kg)      Physical Exam Vitals and nursing note reviewed.  Constitutional:      General: He is not in acute distress. HENT:     Head: Normocephalic and atraumatic.     Nose: Nose normal.  Eyes:     General: No scleral icterus.    Extraocular Movements: Extraocular movements intact.     Conjunctiva/sclera: Conjunctivae normal.     Pupils: Pupils are equal, round, and reactive to light.  Cardiovascular:     Heart sounds: Normal heart sounds.  Pulmonary:     Effort: Pulmonary effort is normal.     Breath sounds: Normal breath sounds.  Musculoskeletal:        General: Normal range of motion.     Right lower leg: No edema.     Left lower leg: No edema.  Skin:    General: Skin is warm and dry.     Findings: No rash.  Neurological:     Mental Status: He is alert  and oriented to person, place, and time.  Psychiatric:        Mood and Affect: Mood normal.        Behavior: Behavior normal.        Thought Content: Thought content normal.  Judgment: Judgment normal.      No results found for any visits on 01/23/24.  Last CBC Lab Results  Component Value Date   WBC 4.3 02/08/2023   HGB 15.2 02/08/2023   HCT 44.2 02/08/2023   MCV 92 02/08/2023   MCH 31.6 02/08/2023   RDW 12.5 02/08/2023   PLT 183 02/08/2023   Last metabolic panel Lab Results  Component Value Date   GLUCOSE 112 (H) 02/08/2023   NA 136 02/08/2023   K 4.1 02/08/2023   CL 100 02/08/2023   CO2 23 02/08/2023   BUN 13 02/08/2023   CREATININE 0.91 02/08/2023   EGFR 101 02/08/2023   CALCIUM  9.1 02/08/2023   PROT 7.2 02/08/2023   ALBUMIN 4.6 02/08/2023   LABGLOB 2.6 02/08/2023   AGRATIO 2.0 02/11/2021   BILITOT 0.8 02/08/2023   ALKPHOS 57 02/08/2023   AST 31 02/08/2023   ALT 42 02/08/2023   Last lipids Lab Results  Component Value Date   CHOL 228 (H) 05/19/2023   HDL 42 05/19/2023   LDLCALC 109 (H) 05/19/2023   TRIG 445 (H) 05/19/2023   CHOLHDL 5.4 (H) 05/19/2023    Last thyroid  functions Lab Results  Component Value Date   TSH 1.280 02/08/2023   T4TOTAL 7.0 02/08/2023        Assessment & Plan:  Mixed hyperlipidemia -     Atorvastatin  Calcium ; Take 1 tablet (20 mg total) by mouth daily.  Dispense: 90 tablet; Refill: 0  Essential hypertension -     Losartan  Potassium-HCTZ; Take 1 tablet by mouth daily.  Dispense: 90 tablet; Refill: 0  Other orders -     amLODIPine  Besylate; Take 1 tablet (5 mg total) by mouth daily.  Dispense: 90 tablet; Refill: 0   Mario Bradley is a 53 year old Caucasian male seen today for chronic disease management, no acute distress Hypertension not well-controlled: Medication plan to continue hyzaar 100-12.5 mg and add amlodipine  5 mg. Continue diet and exercising; limit sodium intake/DASH diet Future: Repeat all labs except  lipids Return in about 4 weeks (around 02/20/2024).    Mario Schloemer [place] Thompson, DNP [facility] [address] [place], [place] [address] [phone]    Note: This document was prepared by [employer] voice dictation technology and any errors that results from this process are unintentional.

## 2024-02-09 ENCOUNTER — Other Ambulatory Visit: Payer: Self-pay | Admitting: Nurse Practitioner

## 2024-02-09 DIAGNOSIS — I10 Essential (primary) hypertension: Secondary | ICD-10-CM

## 2024-02-09 NOTE — Telephone Encounter (Signed)
 Copied from CRM [id number]. Topic: Clinical - Medication Refill >> Feb 09, 2024 12:37 PM Avram G wrote: Medication: losartan -hydrochlorothiazide (HYZAAR) 100-25 MG tablet [[id number]]  Has the patient contacted their pharmacy? Yes (Agent: If no, request that the patient contact the pharmacy for the refill. If patient does not wish to contact the pharmacy document the reason why and proceed with request.) (Agent: If yes, when and what did the pharmacy advise?)  This is the patient's preferred pharmacy:  [id number] - [place], [place] - [address] [place] [address] Phone: [phone] Fax: [phone]  Is this the correct pharmacy for this prescription? Yes If no, delete pharmacy and type the correct one.   Has the prescription been filled recently? No  Is the patient out of the medication? No  Has the patient been seen for an appointment in the last year OR does the patient have an upcoming appointment? Yes  Can we respond through MyChart? No  Agent: Please be advised that Rx refills may take up to 3 business days. We ask that you follow-up with your pharmacy.

## 2024-02-09 NOTE — Telephone Encounter (Signed)
 Med refill

## 2024-02-16 NOTE — Progress Notes (Deleted)
 Subjective:  Patient ID: Mario Bradley, male    DOB: December 04, 1970, 53 y.o.   MRN: [id number]  Patient Care Team: Deitra Morton Hummer, Nena, NP as PCP - General (Nurse Practitioner) Livingston Rigg, MD as Consulting Physician (Dermatology)   Chief Complaint:  No chief complaint on file.   HPI: Mario Bradley is a 53 y.o. male presenting on 02/20/2024 for No chief complaint on file.   Discussed the use of AI scribe software for clinical note transcription with the patient, who gave verbal consent to proceed.  History of Present Illness       Relevant past medical, surgical, family, and social history reviewed and updated as indicated.  Allergies and medications reviewed and updated. Data reviewed: Chart in Epic.   Past Medical History:  Diagnosis Date   BMI 28.0-28.9,adult    Cancer (HCC) 1995   colon   Psoriasis     Past Surgical History:  Procedure Laterality Date   COLON SURGERY  1995   colon CA   LEG SURGERY     SHOULDER SURGERY      Social History   Socioeconomic History   Marital status: Single    Spouse name: Not on file   Number of children: Not on file   Years of education: Not on file   Highest education level: Not on file  Occupational History   Not on file  Tobacco Use   Smoking status: Never   Smokeless tobacco: Current    Types: Chew  Vaping Use   Vaping status: Never Used  Substance and Sexual Activity   Alcohol use: Yes    Alcohol/week: 25.0 standard drinks of alcohol    Types: 25 Cans of beer per week   Drug use: Not Currently   Sexual activity: Not on file  Other Topics Concern   Not on file  Social History Narrative   Not on file   Social Drivers of Health   Financial Resource Strain: Not on file  Food Insecurity: Not on file  Transportation Needs: Not on file  Physical Activity: Not on file  Stress: Not on file  Social Connections: Not on file  Intimate Partner Violence: Not on file    Outpatient Encounter  Medications as of 02/20/2024  Medication Sig   amLODipine  (NORVASC ) 5 MG tablet Take 1 tablet (5 mg total) by mouth daily.   atorvastatin  (LIPITOR) 20 MG tablet Take 1 tablet (20 mg total) by mouth daily.   Guselkumab  (TREMFYA ) 100 MG/ML SOPN Inject 100 mg into the skin every 8 (eight) weeks. For maintenance.   losartan -hydrochlorothiazide (HYZAAR) 100-25 MG tablet Take 1 tablet by mouth daily.   Omega-3 Fatty Acids (FISH OIL) 1000 MG CAPS    Turmeric 500 MG CAPS Take by mouth.   No facility-administered encounter medications on file as of 02/20/2024.    No Known Allergies  Pertinent ROS per HPI, otherwise unremarkable      Objective:  There were no vitals taken for this visit.   Wt Readings from Last 3 Encounters:  01/23/24 213 lb 12.8 oz (97 kg)  01/09/24 215 lb 6.4 oz (97.7 kg)  05/19/23 218 lb 12.8 oz (99.2 kg)    Physical Exam Physical Exam      Results for orders placed or performed in visit on 05/19/23  Lipid panel   Collection Time: 05/19/23 12:56 PM  Result Value Ref Range   Cholesterol, Total 228 (H) 100 - 199 mg/dL   Triglycerides 554 (H)  0 - 149 mg/dL   HDL 42 >60 mg/dL   VLDL Cholesterol Cal 77 (H) 5 - 40 mg/dL   LDL Chol Calc (NIH) 890 (H) 0 - 99 mg/dL   Chol/HDL Ratio 5.4 (H) 0.0 - 5.0 ratio       Pertinent labs & imaging results that were available during my care of the patient were reviewed by me and considered in my medical decision making.  Assessment & Plan:  There are no diagnoses linked to this encounter.   Assessment and Plan Assessment & Plan       Continue all other maintenance medications.  Follow up plan: No follow-ups on file.   Continue healthy lifestyle choices, including diet (rich in fruits, vegetables, and lean proteins, and low in salt and simple carbohydrates) and exercise (at least 30 minutes of moderate physical activity daily).  Educational handout given for ***  The above assessment and management plan was  discussed with the patient. The patient verbalized understanding of and has agreed to the management plan. Patient is aware to call the clinic if they develop any new symptoms or if symptoms persist or worsen. Patient is aware when to return to the clinic for a follow-up visit. Patient educated on when it is appropriate to go to the emergency department.  @SIGNATURE @

## 2024-02-20 ENCOUNTER — Ambulatory Visit: Admitting: Nurse Practitioner

## 2024-06-06 ENCOUNTER — Ambulatory Visit: Admitting: Nurse Practitioner

## 2024-06-06 ENCOUNTER — Encounter: Payer: Self-pay | Admitting: Nurse Practitioner

## 2024-06-06 VITALS — BP 140/87 | HR 84 | Temp 98.6°F | Ht 71.0 in | Wt 220.0 lb

## 2024-06-06 DIAGNOSIS — E782 Mixed hyperlipidemia: Secondary | ICD-10-CM | POA: Diagnosis not present

## 2024-06-06 DIAGNOSIS — E781 Pure hyperglyceridemia: Secondary | ICD-10-CM | POA: Diagnosis not present

## 2024-06-06 DIAGNOSIS — I10 Essential (primary) hypertension: Secondary | ICD-10-CM

## 2024-06-06 DIAGNOSIS — N529 Male erectile dysfunction, unspecified: Secondary | ICD-10-CM | POA: Diagnosis not present

## 2024-06-06 DIAGNOSIS — E66811 Obesity, class 1: Secondary | ICD-10-CM

## 2024-06-06 DIAGNOSIS — E6609 Other obesity due to excess calories: Secondary | ICD-10-CM

## 2024-06-06 DIAGNOSIS — Z6830 Body mass index (BMI) 30.0-30.9, adult: Secondary | ICD-10-CM

## 2024-06-06 DIAGNOSIS — R351 Nocturia: Secondary | ICD-10-CM

## 2024-06-06 DIAGNOSIS — Z125 Encounter for screening for malignant neoplasm of prostate: Secondary | ICD-10-CM | POA: Diagnosis not present

## 2024-06-06 MED ORDER — ATORVASTATIN CALCIUM 40 MG PO TABS: 40.0000 mg | ORAL_TABLET | Freq: Every day | ORAL | 0 refills | Status: AC

## 2024-06-06 MED ORDER — AMLODIPINE BESYLATE 10 MG PO TABS: 10.0000 mg | ORAL_TABLET | Freq: Every day | ORAL | 0 refills | Status: AC

## 2024-06-06 MED ORDER — LOSARTAN POTASSIUM-HCTZ 100-25 MG PO TABS: 1.0000 | ORAL_TABLET | Freq: Every day | ORAL | 0 refills | Status: AC

## 2024-06-06 MED ORDER — OMEGA-3-ACID ETHYL ESTERS 1 G PO CAPS: 1.0000 g | ORAL_CAPSULE | Freq: Two times a day (BID) | ORAL | 0 refills | Status: AC

## 2024-06-06 NOTE — Progress Notes (Unsigned)
 "        Subjective:  Patient ID: Mario Bradley, male    DOB: 05/10/1971, 54 y.o.   MRN: [id number]  Patient Care Team: Deitra Morton Sebastian Nena, NP as PCP - General (Nurse Practitioner) Livingston Rigg, MD as Consulting Physician (Dermatology)   Chief Complaint:  Medical Management of Chronic Issues   HPI: Mario Bradley is a 54 y.o. male presenting on 06/06/2024 for Medical Management of Chronic Issues   Discussed the use of AI scribe software for clinical note transcription with the patient, who gave verbal consent to proceed.  History of Present Illness Mario Bradley is a 54 year old male with hypertension and hyperlipidemia who presents for blood pressure management.  His blood pressure remains elevated despite home monitoring, with readings ranging from 129 to 133 mmHg. During today's visit, his blood pressure was 161/87 mmHg. No excessive salt intake; he cooks his own meals and eats out infrequently. His diet today included a banana, a piece of toast, two cups of green tea, and some almonds and walnuts. He maintains an active lifestyle, walking daily and going to the gym. He is currently taking losartan  100/25 mg and hydrochlorothiazide. He mentions that he is running low on amlodipine , which he takes at a dose of 5 mg. He is also taking fish oil supplements.  Regarding his hyperlipidemia, his last lipid panel in January showed a total cholesterol of 440 mg/dL and triglycerides of 45 mg/dL. He is currently on Lipitor, which he has been instructed to adjust to 40 mg.  He inquires about weight loss options, mentioning a BMI of 30.66 and expressing interest in potential treatments covered by his insurance.       06/06/2024    3:54 PM 02/08/2023    3:06 PM 02/11/2021   11:30 AM  PHQ9 SCORE ONLY  PHQ-9 Total Score 0 0  0      Data saved with a previous flowsheet row definition       06/06/2024    3:55 PM 02/08/2023    3:07 PM 02/11/2021   11:31 AM  GAD 7 :  Generalized Anxiety Score  Nervous, Anxious, on Edge 0 0  0   Control/stop worrying 0 0  0   Worry too much - different things 0 0  0   Trouble relaxing 0 0  0   Restless 0 0  0   Easily annoyed or irritable 0 0  0   Afraid - awful might happen 0 0  0   Total GAD 7 Score 0 0 0  Anxiety Difficulty Not difficult at all Not difficult at all Not difficult at all     Data saved with a previous flowsheet row definition      Relevant past medical, surgical, family, and social history reviewed and updated as indicated.  Allergies and medications reviewed and updated. Data reviewed: Chart in Epic.   Past Medical History:  Diagnosis Date   BMI 28.0-28.9,adult    Cancer (HCC) 1995   colon   Psoriasis     Past Surgical History:  Procedure Laterality Date   COLON SURGERY  1995   colon CA   LEG SURGERY     SHOULDER SURGERY      Social History   Socioeconomic History   Marital status: Single    Spouse name: Not on file   Number of children: Not on file   Years of education: Not on file   Highest education level: Not  on file  Occupational History   Not on file  Tobacco Use   Smoking status: Never   Smokeless tobacco: Current    Types: Chew  Vaping Use   Vaping status: Never Used  Substance and Sexual Activity   Alcohol use: Yes    Alcohol/week: 25.0 standard drinks of alcohol    Types: 25 Cans of beer per week   Drug use: Not Currently   Sexual activity: Not on file  Other Topics Concern   Not on file  Social History Narrative   Not on file   Social Drivers of Health   Tobacco Use: High Risk (06/06/2024)   Patient History    Smoking Tobacco Use: Never    Smokeless Tobacco Use: Current    Passive Exposure: Not on file  Financial Resource Strain: Not on file  Food Insecurity: No Food Insecurity (06/06/2024)   Epic    Worried About Programme Researcher, Broadcasting/film/video in the Last Year: Never true    Ran Out of Food in the Last Year: Never true  Transportation Needs: No  Transportation Needs (06/06/2024)   Epic    Lack of Transportation (Medical): No    Lack of Transportation (Non-Medical): No  Physical Activity: Not on file  Stress: Not on file  Social Connections: Not on file  Intimate Partner Violence: Not At Risk (06/06/2024)   Epic    Fear of Current or Ex-Partner: No    Emotionally Abused: No    Physically Abused: No    Sexually Abused: No  Depression (PHQ2-9): Low Risk (06/06/2024)   Depression (PHQ2-9)    PHQ-2 Score: 0  Alcohol Screen: Not on file  Housing: Low Risk (06/06/2024)   Epic    Unable to Pay for Housing in the Last Year: No    Number of Times Moved in the Last Year: 0    Homeless in the Last Year: No  Utilities: Not At Risk (06/06/2024)   Epic    Threatened with loss of utilities: No  Health Literacy: Not on file    Outpatient Encounter Medications as of 06/06/2024  Medication Sig   amLODipine  (NORVASC ) 10 MG tablet Take 1 tablet (10 mg total) by mouth daily.   atorvastatin  (LIPITOR) 40 MG tablet Take 1 tablet (40 mg total) by mouth daily.   Guselkumab  (TREMFYA ) 100 MG/ML SOPN Inject 100 mg into the skin every 8 (eight) weeks. For maintenance.   omega-3 acid ethyl esters (LOVAZA ) 1 g capsule Take 1 capsule (1 g total) by mouth 2 (two) times daily.   Turmeric 500 MG CAPS Take by mouth.   [DISCONTINUED] amLODipine  (NORVASC ) 5 MG tablet Take 1 tablet (5 mg total) by mouth daily.   [DISCONTINUED] atorvastatin  (LIPITOR) 20 MG tablet Take 1 tablet (20 mg total) by mouth daily.   [DISCONTINUED] losartan -hydrochlorothiazide (HYZAAR) 100-25 MG tablet Take 1 tablet by mouth daily.   [DISCONTINUED] Omega-3 Fatty Acids (FISH OIL) 1000 MG CAPS    losartan -hydrochlorothiazide (HYZAAR) 100-25 MG tablet Take 1 tablet by mouth daily.   No facility-administered encounter medications on file as of 06/06/2024.    Allergies[1]  Pertinent ROS per HPI, otherwise unremarkable      Objective:  BP (!) 140/87   Pulse 84   Temp 98.6 F (37 C)    Ht 5' 11 (1.803 m)   Wt 220 lb (99.8 kg)   SpO2 97%   BMI 30.68 kg/m    Wt Readings from Last 3 Encounters:  06/06/24 220 lb (99.8 kg)  01/23/24 213 lb 12.8 oz (97 kg)  01/09/24 215 lb 6.4 oz (97.7 kg)    Physical Exam Vitals and nursing note reviewed.  Constitutional:      General: He is not in acute distress. HENT:     Head: Normocephalic and atraumatic.     Nose: Nose normal.     Mouth/Throat:     Mouth: Mucous membranes are moist.  Eyes:     Extraocular Movements: Extraocular movements intact.     Conjunctiva/sclera: Conjunctivae normal.     Pupils: Pupils are equal, round, and reactive to light.  Cardiovascular:     Heart sounds: Normal heart sounds.  Pulmonary:     Effort: Pulmonary effort is normal.     Breath sounds: Normal breath sounds.  Musculoskeletal:        General: Normal range of motion.     Right lower leg: No edema.     Left lower leg: No edema.  Skin:    General: Skin is warm and dry.     Findings: No rash.  Neurological:     Mental Status: He is alert and oriented to person, place, and time.  Psychiatric:        Mood and Affect: Mood normal.        Behavior: Behavior normal.        Thought Content: Thought content normal.        Judgment: Judgment normal.    Physical Exam VITALS: BP- 161/87 MEASUREMENTS: Height- 5'11, BMI- 30.66.     Results for orders placed or performed in visit on 06/06/24  PSA, total and free   Collection Time: 06/06/24  4:13 PM  Result Value Ref Range   Prostate Specific Ag, Serum 0.8 0.0 - 4.0 ng/mL   PSA, Free 0.21 N/A ng/mL   PSA, Free Pct 26.3 %  Thyroid  Panel With TSH   Collection Time: 06/06/24  4:13 PM  Result Value Ref Range   TSH 1.200 0.450 - 4.500 uIU/mL   T4, Total 7.3 4.5 - 12.0 ug/dL   T3 Uptake Ratio 30 24 - 39 %   Free Thyroxine Index 2.2 1.2 - 4.9  CBC with Differential/Platelet   Collection Time: 06/06/24  4:13 PM  Result Value Ref Range   WBC 4.9 3.4 - 10.8 x10E3/uL   RBC 4.94 4.14 -  5.80 x10E6/uL   Hemoglobin 15.6 13.0 - 17.7 g/dL   Hematocrit 55.0 62.4 - 51.0 %   MCV 91 79 - 97 fL   MCH 31.6 26.6 - 33.0 pg   MCHC 34.7 31.5 - 35.7 g/dL   RDW 87.5 88.3 - 84.5 %   Platelets 210 150 - 450 x10E3/uL   Neutrophils 51 Not Estab. %   Lymphs 35 Not Estab. %   Monocytes 11 Not Estab. %   Eos 2 Not Estab. %   Basos 1 Not Estab. %   Neutrophils Absolute 2.5 1.4 - 7.0 x10E3/uL   Lymphocytes Absolute 1.7 0.7 - 3.1 x10E3/uL   Monocytes Absolute 0.5 0.1 - 0.9 x10E3/uL   EOS (ABSOLUTE) 0.1 0.0 - 0.4 x10E3/uL   Basophils Absolute 0.0 0.0 - 0.2 x10E3/uL   Immature Granulocytes 0 Not Estab. %   Immature Grans (Abs) 0.0 0.0 - 0.1 x10E3/uL  CMP14+EGFR   Collection Time: 06/06/24  4:13 PM  Result Value Ref Range   Glucose 91 70 - 99 mg/dL   BUN 13 6 - 24 mg/dL   Creatinine, Ser 9.13 0.76 - 1.27 mg/dL   eGFR  102 >59 mL/min/1.73   BUN/Creatinine Ratio 15 9 - 20   Sodium 136 134 - 144 mmol/L   Potassium 3.7 3.5 - 5.2 mmol/L   Chloride 95 (L) 96 - 106 mmol/L   CO2 24 20 - 29 mmol/L   Calcium  9.5 8.7 - 10.2 mg/dL   Total Protein 7.4 6.0 - 8.5 g/dL   Albumin 4.6 3.8 - 4.9 g/dL   Globulin, Total 2.8 1.5 - 4.5 g/dL   Bilirubin Total 1.2 0.0 - 1.2 mg/dL   Alkaline Phosphatase 54 47 - 123 IU/L   AST 29 0 - 40 IU/L   ALT 41 0 - 44 IU/L  Microalbumin / creatinine urine ratio   Collection Time: 06/06/24  4:21 PM  Result Value Ref Range   Creatinine, Urine 68.5 Not Estab. mg/dL   Microalbumin, Urine 3.8 Not Estab. ug/mL   Microalb/Creat Ratio 6 0 - 29 mg/g creat       Pertinent labs & imaging results that were available during my care of the patient were reviewed by me and considered in my medical decision making.  Assessment & Plan:  Mario Bradley was seen today for medical management of chronic issues.  Diagnoses and all orders for this visit:  Primary hypertension -     amLODipine  (NORVASC ) 10 MG tablet; Take 1 tablet (10 mg total) by mouth daily. -     CMP14+EGFR -      Microalbumin / creatinine urine ratio  Mixed hyperlipidemia  Erectile dysfunction, unspecified erectile dysfunction type -     CBC with Differential/Platelet  Pure hypertriglyceridemia -     omega-3 acid ethyl esters (LOVAZA ) 1 g capsule; Take 1 capsule (1 g total) by mouth 2 (two) times daily.  Class 1 obesity due to excess calories without serious comorbidity with body mass index (BMI) of 30.0 to 30.9 in adult -     Thyroid  Panel With TSH  Screening PSA (prostate specific antigen) -     PSA, total and free  Nocturia -     PSA, total and free  Essential hypertension -     losartan -hydrochlorothiazide (HYZAAR) 100-25 MG tablet; Take 1 tablet by mouth daily.  Other orders -     atorvastatin  (LIPITOR) 40 MG tablet; Take 1 tablet (40 mg total) by mouth daily.     Assessment and Plan Mario Bradley is a 54 year old Caucasian male seen today for chronic disease management, no acute distress Assessment & Plan Primary hypertension Blood pressure elevated at 161/87 mmHg despite home readings of 129-133 mmHg. Current regimen includes losartan  and hydrochlorothiazide. Amlodipine  dose increased for better control. - Increased amlodipine  to 10 mg daily. - Continue losartan  and hydrochlorothiazide as prescribed. - Scheduled nurse visit in two weeks for blood pressure check. - Plan referral to hypertensive clinic if blood pressure remains uncontrolled.  Mixed hyperlipidemia with hypertriglyceridemia Cholesterol at 440 mg/dL, triglycerides at 45 mg/dL. Current treatment includes atorvastatin . Lovaza  prescribed for hypertriglyceridemia. - Prescribed Lovaza , 1 gram twice daily. - Increased atorvastatin  to 40 mg daily.  Class 1 obesity BMI 30.66. Discussed weight loss interventions, including GLP-1 receptor agonists, contingent on insurance coverage. Emphasized lifestyle modifications. - Advised contacting insurance to verify coverage for obesity treatment. - Discussed potential for GLP-1 receptor  agonists if covered by insurance. - Encouraged lifestyle modifications for weight management.  General health maintenance Routine health maintenance discussed, including lab work and screenings. - Ordered PSA and thyroid  function tests. - Obtained urine sample to check for proteinuria.   Labs CBC, CMP, lipid,  TSH, PSA, micro albumin result pending  Continue all other maintenance medications.  Follow up plan: Return in about 3 months (around 09/04/2024) for chronic Diseases management.   Continue healthy lifestyle choices, including diet (rich in fruits, vegetables, and lean proteins, and low in salt and simple carbohydrates) and exercise (at least 30 minutes of moderate physical activity daily).  Educational handout given for   Clinical References  BMI for Adults Body mass index (BMI) is a number found using a person's weight and height. BMI can help tell how much of a person's weight is made up of fat. BMI does not measure body fat directly. It is used instead of tests that directly measure body fat, which can be difficult and expensive. What are BMI measurements used for? BMI is useful to: Find out if your weight puts you at higher risk for medical problems. Help recommend changes, such as in diet and exercise. This can help you reach a healthy weight. BMI screening can be done again to see if these changes are working. How is BMI calculated? Your height and weight are measured. The BMI is found from those numbers. This can be done with [place] or metric measurements. Note that charts and online BMI calculators are available to help you find your BMI quickly and easily without doing these calculations. To calculate your BMI in [place] measurements: Measure your weight in pounds (lb). Multiply the number of pounds by 703. So, for an adult who weighs 150 lb, multiply that number by 703: 150 x 703, which equals 105,450. Measure your height in inches. Then multiply that number by itself to  get a measurement called inches squared. So, for an adult who is 70 inches tall, the inches squared measurement is 70 inches x 70 inches, which equals 4,900 inches squared. Divide the total from step 2 (number of lb x 703) by the total from step 3 (inches squared): 105,450  4,900 = 21.5. This is your BMI. To calculate your BMI in metric measurements:  Measure your weight in kilograms (kg). For this example, the weight is 70 kg. Measure your height in meters (m). Then multiply that number by itself to get a measurement called meters squared. So, for an adult who is 1.75 m tall, the meters squared measurement is 1.75 m x 1.75 m, which equals 3.1 meters squared. Divide the number of kilograms (your weight) by the meters squared number. In this example: 70  3.1 = 22.6. This is your BMI. What do the results mean? BMI charts are used to see if you are underweight, normal weight, overweight, or obese. The following guidelines will be used: Underweight: BMI less than 18.5. Normal weight: BMI between 18.5 and 24.9. Overweight: BMI between 25 and 29.9. Obese: BMI of 30 or above. BMI is a tool and cannot diagnose a condition. Talk with your health care provider about what your BMI means for you. Keep these notes in mind: Weight includes fat and muscle. Someone with a muscular build, such as an athlete, may have a BMI that is higher than 24.9. In cases like these, BMI is not a correct measure of body fat. If you have a BMI of 25 or higher, your provider may need to do more testing to find out if excess body fat is the cause. BMI is measured the same way for males and females. Females usually have more body fat than males of the same height and weight. Where to find more information For more information about BMI,  including tools to quickly find your BMI, go to: Centers for Disease Control and Prevention: tonerpromos.no American Heart Association: heart.org National Heart, Lung, and Blood Institute:  buffalodrycleaner.gl This information is not intended to replace advice given to you by your health care provider. Make sure you discuss any questions you have with your health care provider. Document Revised: 01/21/2022 Document Reviewed: 01/14/2022 Elsevier Patient Education  2024 Else[employer] Inc. Obesity, Adult Obesity is the condition of having too much total body fat. Being overweight or obese means that your weight is greater than what is considered healthy for your body size. Obesity is determined by a measurement called BMI (body mass index). BMI is an estimate of body fat and is calculated from height and weight. For adults, a BMI of 30 or higher is considered obese. Obesity can lead to other health concerns and major illnesses, including: Stroke. Coronary artery disease (CAD). Type 2 diabetes. Some types of cancer, including cancers of the colon, breast, uterus, and gallbladder. High blood pressure (hypertension). High cholesterol. Gallbladder stones. Obesity can also contribute to: Osteoarthritis. Sleep apnea. Infertility problems. What are the causes? Common causes of this condition include: Eating daily meals that are high in calories, sugar, and fat. Drinking high amounts of sugar-sweetened beverages, such as soft drinks. Being born with genes that may make you more likely to become obese. Having a medical condition that causes obesity, including: Hypothyroidism. Polycystic ovarian syndrome (PCOS). Binge-eating disorder. Cushing syndrome. Taking certain medicines, such as steroids, antidepressants, and seizure medicines. Not being physically active (sedentary lifestyle). Not getting enough sleep. What increases the risk? The following factors may make you more likely to develop this condition: Having a family history of obesity. Living in an area with limited access to: [place], recreation centers, or sidewalks. Healthy food choices, such as grocery stores and farmers'  markets. What are the signs or symptoms? The main sign of this condition is having too much body fat. How is this diagnosed? This condition is diagnosed based on: Your BMI. If you are an adult with a BMI of 30 or higher, you are considered obese. Your waist circumference. This measures the distance around your waistline. Your skinfold thickness. Your health care provider may gently pinch a fold of your skin and measure it. You may have other tests to check for underlying conditions. How is this treated? Treatment for this condition often includes changing your lifestyle. Treatment may include some or all of the following: Dietary changes. This may include developing a healthy meal plan. Regular physical activity. This may include activity that causes your heart to beat faster (aerobic exercise) and strength training. Work with your health care provider to design an exercise program that works for you. Medicine to help you lose weight if you are unable to lose one pound a week after six weeks of healthy eating and more physical activity. Treating conditions that cause the obesity (underlying conditions). Surgery. Surgical options may include gastric banding and gastric bypass. Surgery may be done if: Other treatments have not helped to improve your condition. You have a BMI of 40 or higher. You have life-threatening health problems related to obesity. Follow these instructions at home: Eating and drinking  Follow recommendations from your health care provider about what you eat and drink. Your health care provider may advise you to: Limit fast food, sweets, and processed snack foods. Choose low-fat options, such as low-fat milk instead of whole milk. Eat five or more servings of fruits or vegetables every day. Choose  healthy foods when you eat out. Keep low-fat snacks available. Limit sugary drinks, such as soda, fruit juice, sweetened iced tea, and flavored milk. Drink enough water to  keep your urine pale yellow. Do not follow a fad diet. Fad diets can be unhealthy and even dangerous. Other healthful choices include: Eat at home more often. This gives you more control over what you eat. Learn to read food labels. This will help you understand how much food is considered one serving. Learn what a healthy serving size is. Physical activity Exercise regularly, as told by your health care provider. Most adults should get up to 150 minutes of moderate-intensity exercise every week. Ask your health care provider what types of exercise are safe for you and how often you should exercise. Warm up and stretch before being active. Cool down and stretch after being active. Rest between periods of activity. Lifestyle Work with your health care provider and a dietitian to set a weight-loss goal that is healthy and reasonable for you. Limit your screen time. Find ways to reward yourself that do not involve food. Do not drink alcohol if: Your health care provider tells you not to drink. You are pregnant, may be pregnant, or are planning to become pregnant. If you drink alcohol: Limit how much you have to: 0-1 drink a day for women. 0-2 drinks a day for men. Know how much alcohol is in your drink. In the [place] one drink equals one 12 oz bottle of beer (355 mL), one 5 oz glass of wine (148 mL), or one 1 oz glass of hard liquor (44 mL). General instructions Keep a weight-loss journal to keep track of the food you eat and how much exercise you get. Take over-the-counter and prescription medicines only as told by your health care provider. Take vitamins and supplements only as told by your health care provider. Consider joining a support group. Your health care provider may be able to recommend a support group. Pay attention to your mental health as obesity can lead to depression or self esteem issues. Keep all follow-up visits. This is important. Contact a health care provider  if: You are unable to meet your weight-loss goal after six weeks of dietary and lifestyle changes. You have trouble breathing. Summary Obesity is the condition of having too much total body fat. Being overweight or obese means that your weight is greater than what is considered healthy for your body size. Work with your health care provider and a dietitian to set a weight-loss goal that is healthy and reasonable for you. Exercise regularly, as told by your health care provider. Ask your health care provider what types of exercise are safe for you and how often you should exercise. This information is not intended to replace advice given to you by your health care provider. Make sure you discuss any questions you have with your health care provider. Document Revised: 12/09/2020 Document Reviewed: 12/09/2020 Elsevier Patient Education  2024 Elsevier Inc. Erectile Dysfunction Erectile dysfunction (ED) is the inability to get or keep an erection in order to have sexual intercourse. ED is considered a symptom of an underlying disorder and is not considered a disease. ED may include: Inability to get an erection. Lack of enough hardness of the erection to allow penetration. Loss of erection before sex is finished. What are the causes? This condition may be caused by: Physical causes, such as: Artery problems. This may include heart disease, high blood pressure, atherosclerosis, and diabetes. Hormonal problems, such  as low testosterone. Obesity. Nerve problems. This may include back or pelvic injuries, multiple sclerosis, Parkinson's disease, spinal cord injury, and stroke. Certain medicines, such as: Pain relievers. Antidepressants. Blood pressure medicines and water pills (diuretics). Cancer medicines. Antihistamines. Muscle relaxants. Lifestyle factors, such as: Use of drugs such as marijuana, cocaine, or opioids. Excessive use of alcohol. Smoking. Lack of physical activity or  exercise. Psychological causes, such as: Anxiety or stress. Sadness or depression. Exhaustion. Fear about sexual performance. Guilt. What are the signs or symptoms? Symptoms of this condition include: Inability to get an erection. Lack of enough hardness of the erection to allow penetration. Loss of the erection before sex is finished. Sometimes having normal erections, but with frequent unsatisfactory episodes. Low sexual satisfaction in either partner due to erection problems. A curved penis occurring with erection. The curve may cause pain, or the penis may be too curved to allow for intercourse. Never having nighttime or morning erections. How is this diagnosed? This condition is often diagnosed by: Performing a physical exam to find other diseases or specific problems with the penis. Asking you detailed questions about the problem. Doing tests, such as: Blood tests to check for diabetes mellitus or high cholesterol, or to measure hormone levels. Other tests to check for underlying health conditions. An ultrasound exam to check for scarring. A test to check blood flow to the penis. Doing a sleep study at home to measure nighttime erections. How is this treated? This condition may be treated by: Medicines, such as: Medicine taken by mouth to help you achieve an erection (oral medicine). Hormone replacement therapy to replace low testosterone levels. Medicine that is injected into the penis. Your health care provider may instruct you how to give yourself these injections at home. Medicine that is delivered with a short applicator tube. The tube is inserted into the opening at the tip of the penis, which is the opening of the urethra. A tiny pellet of medicine is put in the urethra. The pellet dissolves and enhances erectile function. This is also called MUSE (medicated urethral system for erections) therapy. Vacuum pump. This is a pump with a ring on it. The pump and ring are  placed on the penis and used to create pressure that helps the penis become erect. Penile implant surgery. In this procedure, you may receive: An inflatable implant. This consists of cylinders, a pump, and a reservoir. The cylinders can be inflated with a fluid that helps to create an erection, and they can be deflated after intercourse. A semi-rigid implant. This consists of two silicone rubber rods. The rods provide some rigidity. They are also flexible, so the penis can both curve downward in its normal position and become straight for sexual intercourse. Blood vessel surgery to improve blood flow to the penis. During this procedure, a blood vessel from a different part of the body is placed into the penis to allow blood to flow around (bypass) damaged or blocked blood vessels. Lifestyle changes, such as exercising more, losing weight, and quitting smoking. Follow these instructions at home: Medicines  Take over-the-counter and prescription medicines only as told by your health care provider. Do not increase the dosage without first discussing it with your health care provider. If you are using self-injections, do injections as directed by your health care provider. Make sure you avoid any veins that are on the surface of the penis. After giving an injection, apply pressure to the injection site for 5 minutes. Talk to your health care  provider about how to prevent headaches while taking ED medicines. These medicines may cause a sudden headache due to the increase in blood flow in your body. General instructions Exercise regularly, as directed by your health care provider. Work with your health care provider to lose weight, if needed. Do not use any products that contain nicotine or tobacco. These products include cigarettes, chewing tobacco, and vaping devices, such as e-cigarettes. If you need help quitting, ask your health care provider. Before using a vacuum pump, read the instructions that come  with the pump and discuss any questions with your health care provider. Keep all follow-up visits. This is important. Contact a health care provider if: You feel nauseous. You are vomiting. You get sudden headaches while taking ED medicines. You have any concerns about your sexual health. Get help right away if: You are taking oral or injectable medicines and you have an erection that lasts longer than 4 hours. If your health care provider is unavailable, go to the nearest emergency room for evaluation. An erection that lasts much longer than 4 hours can result in permanent damage to your penis. You have severe pain in your groin or abdomen. You develop redness or severe swelling of your penis. You have redness spreading at your groin or lower abdomen. You are unable to urinate. You experience chest pain or a rapid heartbeat (palpitations) after taking oral medicines. These symptoms may represent a serious problem that is an emergency. Do not wait to see if the symptoms will go away. Get medical help right away. Call your local emergency services (911 in the [place] Do not drive yourself to the hospital. Summary Erectile dysfunction (ED) is the inability to get or keep an erection during sexual intercourse. This condition is diagnosed based on a physical exam, your symptoms, and tests to determine the cause. Treatment varies depending on the cause and may include medicines, hormone therapy, surgery, or a vacuum pump. You may need follow-up visits to make sure that you are using your medicines or devices correctly. Get help right away if you are taking or injecting medicines and you have an erection that lasts longer than 4 hours. This information is not intended to replace advice given to you by your health care provider. Make sure you discuss any questions you have with your health care provider. Document Revised: 07/30/2020 Document Reviewed: 07/30/2020 Elsevier Patient Education  2025  [employer]. Hypertension, Adult High blood pressure (hypertension) is when the force of blood pumping through the arteries is too strong. The arteries are the blood vessels that carry blood from the heart throughout the body. Hypertension forces the heart to work harder to pump blood and may cause arteries to become narrow or stiff. Untreated or uncontrolled hypertension can lead to a heart attack, heart failure, a stroke, kidney disease, and other problems. A blood pressure reading consists of a higher number over a lower number. Ideally, your blood pressure should be below 120/80. The first (top) number is called the systolic pressure. It is a measure of the pressure in your arteries as your heart beats. The second (bottom) number is called the diastolic pressure. It is a measure of the pressure in your arteries as the heart relaxes. What are the causes? The exact cause of this condition is not known. There are some conditions that result in high blood pressure. What increases the risk? Certain factors may make you more likely to develop high blood pressure. Some of these risk factors are under  your control, including: Smoking. Not getting enough exercise or physical activity. Being overweight. Having too much fat, sugar, calories, or salt (sodium) in your diet. Drinking too much alcohol. Other risk factors include: Having a personal history of heart disease, diabetes, high cholesterol, or kidney disease. Stress. Having a family history of high blood pressure and high cholesterol. Having obstructive sleep apnea. Age. The risk increases with age. What are the signs or symptoms? High blood pressure may not cause symptoms. Very high blood pressure (hypertensive crisis) may cause: Headache. Fast or irregular heartbeats (palpitations). Shortness of breath. Nosebleed. Nausea and vomiting. Vision changes. Severe chest pain, dizziness, and seizures. How is this diagnosed? This condition  is diagnosed by measuring your blood pressure while you are seated, with your arm resting on a flat surface, your legs uncrossed, and your feet flat on the floor. The cuff of the blood pressure monitor will be placed directly against the skin of your upper arm at the level of your heart. Blood pressure should be measured at least twice using the same arm. Certain conditions can cause a difference in blood pressure between your right and left arms. If you have a high blood pressure reading during one visit or you have normal blood pressure with other risk factors, you may be asked to: Return on a different day to have your blood pressure checked again. Monitor your blood pressure at home for 1 week or longer. If you are diagnosed with hypertension, you may have other blood or imaging tests to help your health care provider understand your overall risk for other conditions. How is this treated? This condition is treated by making healthy lifestyle changes, such as eating healthy foods, exercising more, and reducing your alcohol intake. You may be referred for counseling on a healthy diet and physical activity. Your health care provider may prescribe medicine if lifestyle changes are not enough to get your blood pressure under control and if: Your systolic blood pressure is above 130. Your diastolic blood pressure is above 80. Your personal target blood pressure may vary depending on your medical conditions, your age, and other factors. Follow these instructions at home: Eating and drinking  Eat a diet that is high in fiber and potassium, and low in sodium, added sugar, and fat. An example of this eating plan is called the DASH diet. DASH stands for Dietary Approaches to Stop Hypertension. To eat this way: Eat plenty of fresh fruits and vegetables. Try to fill one half of your plate at each meal with fruits and vegetables. Eat whole grains, such as whole-wheat pasta, brown rice, or whole-grain bread.  Fill about one fourth of your plate with whole grains. Eat or drink low-fat dairy products, such as skim milk or low-fat yogurt. Avoid fatty cuts of meat, processed or cured meats, and poultry with skin. Fill about one fourth of your plate with lean proteins, such as fish, chicken without skin, beans, eggs, or tofu. Avoid pre-made and processed foods. These tend to be higher in sodium, added sugar, and fat. Reduce your daily sodium intake. Many people with hypertension should eat less than 1,500 mg of sodium a day. Do not drink alcohol if: Your health care provider tells you not to drink. You are pregnant, may be pregnant, or are planning to become pregnant. If you drink alcohol: Limit how much you have to: 0-1 drink a day for women. 0-2 drinks a day for men. Know how much alcohol is in your drink. In the [place] one  drink equals one 12 oz bottle of beer (355 mL), one 5 oz glass of wine (148 mL), or one 1 oz glass of hard liquor (44 mL). Lifestyle  Work with your health care provider to maintain a healthy body weight or to lose weight. Ask what an ideal weight is for you. Get at least 30 minutes of exercise that causes your heart to beat faster (aerobic exercise) most days of the week. Activities may include walking, swimming, or biking. Include exercise to strengthen your muscles (resistance exercise), such as Pilates or lifting weights, as part of your weekly exercise routine. Try to do these types of exercises for 30 minutes at least 3 days a week. Do not use any products that contain nicotine or tobacco. These products include cigarettes, chewing tobacco, and vaping devices, such as e-cigarettes. If you need help quitting, ask your health care provider. Monitor your blood pressure at home as told by your health care provider. Keep all follow-up visits. This is important. Medicines Take over-the-counter and prescription medicines only as told by your health care provider. Follow directions  carefully. Blood pressure medicines must be taken as prescribed. Do not skip doses of blood pressure medicine. Doing this puts you at risk for problems and can make the medicine less effective. Ask your health care provider about side effects or reactions to medicines that you should watch for. Contact a health care provider if you: Think you are having a reaction to a medicine you are taking. Have headaches that keep coming back (recurring). Feel dizzy. Have swelling in your ankles. Have trouble with your vision. Get help right away if you: Develop a severe headache or confusion. Have unusual weakness or numbness. Feel faint. Have severe pain in your chest or abdomen. Vomit repeatedly. Have trouble breathing. These symptoms may be an emergency. Get help right away. Call 911. Do not wait to see if the symptoms will go away. Do not drive yourself to the hospital. Summary Hypertension is when the force of blood pumping through your arteries is too strong. If this condition is not controlled, it may put you at risk for serious complications. Your personal target blood pressure may vary depending on your medical conditions, your age, and other factors. For most people, a normal blood pressure is less than 120/80. Hypertension is treated with lifestyle changes, medicines, or a combination of both. Lifestyle changes include losing weight, eating a healthy, low-sodium diet, exercising more, and limiting alcohol. This information is not intended to replace advice given to you by your health care provider. Make sure you discuss any questions you have with your health care provider. Document Revised: 03/10/2021 Document Reviewed: 03/10/2021 Elsevier Patient Education  2024 [employer] Dyslipidemia Dyslipidemia is an imbalance of waxy, fat-like substances (lipids) in the blood. The body needs lipids in small amounts. Dyslipidemia often involves a high level of cholesterol or triglycerides, which  are types of lipids. Common forms of dyslipidemia include: High levels of LDL cholesterol. LDL is the type of cholesterol that causes fatty deposits (plaques) to build up in the blood vessels that carry blood away from the heart (arteries). Low levels of HDL cholesterol. HDL cholesterol is the type of cholesterol that protects against heart disease. High levels of HDL remove the LDL buildup from arteries. High levels of triglycerides. Triglycerides are a fatty substance in the blood that is linked to a buildup of plaques in the arteries. What are the causes? There are two main types of dyslipidemia: primary and secondary.  Primary dyslipidemia is caused by changes (mutations) in genes that are passed down through families (inherited). These mutations cause several types of dyslipidemia. Secondary dyslipidemia may be caused by various risk factors that can lead to the disease, such as lifestyle choices and certain medical conditions. What increases the risk? You are more likely to develop this condition if you are an older man or if you are a woman who has gone through menopause. Other risk factors include: Having a family history of dyslipidemia. Taking certain medicines, including birth control pills, steroids, some diuretics, and beta-blockers. Eating a diet high in saturated fat. Smoking cigarettes or excessive alcohol intake. Having certain medical conditions such as diabetes, polycystic ovary syndrome (PCOS), kidney disease, liver disease, or hypothyroidism. Not exercising regularly. Being overweight or obese with too much belly fat. What are the signs or symptoms? In most cases, dyslipidemia does not usually cause any symptoms. In severe cases, very high lipid levels can cause: Fatty bumps under the skin (xanthomas). A white or gray ring around the black center (pupil) of the eye. Very high triglyceride levels can cause inflammation of the pancreas (pancreatitis). How is this  diagnosed? Your health care provider may diagnose dyslipidemia based on a routine blood test (fasting blood test). Because most people do not have symptoms of the condition, this blood testing (lipid profile) is done on adults age 25 and older and is repeated every 4-6 years. This test checks: Total cholesterol. This measures the total amount of cholesterol in your blood, including LDL cholesterol, HDL cholesterol, and triglycerides. A healthy number is below 200 mg/dL (4.82 mmol/L). LDL cholesterol. The target number for LDL cholesterol is different for each person, depending on individual risk factors. A healthy number is usually below 100 mg/dL (7.40 mmol/L). Ask your health care provider what your LDL cholesterol should be. HDL cholesterol. An HDL level of 60 mg/dL (8.44 mmol/L) or higher is best because it helps to protect against heart disease. A number below 40 mg/dL (8.96 mmol/L) for men or below 50 mg/dL (8.70 mmol/L) for women increases the risk for heart disease. Triglycerides. A healthy triglyceride number is below 150 mg/dL (8.30 mmol/L). If your lipid profile is abnormal, your health care provider may do other blood tests. How is this treated? Treatment depends on the type of dyslipidemia that you have and your other risk factors for heart disease and stroke. Your health care provider will have a target range for your lipid levels based on this information. Treatment for dyslipidemia starts with lifestyle changes, such as diet and exercise. Your health care provider may recommend that you: Get regular exercise. Make changes to your diet. Quit smoking if you smoke. Limit your alcohol intake. If diet changes and exercise do not help you reach your goals, your health care provider may also prescribe medicine to lower lipids. The most commonly prescribed type of medicine lowers your LDL cholesterol (statin drug). If you have a high triglyceride level, your provider may prescribe another type  of drug (fibrate) or an omega-3 fish oil supplement, or both. Follow these instructions at home: Eating and drinking  Follow instructions from your health care provider or dietitian about eating or drinking restrictions. Eat a healthy diet as told by your health care provider. This can help you reach and maintain a healthy weight, lower your LDL cholesterol, and raise your HDL cholesterol. This may include: Limiting your calories, if you are overweight. Eating more fruits, vegetables, whole grains, fish, and lean meats. Limiting saturated fat,  trans fat, and cholesterol. Do not drink alcohol if: Your health care provider tells you not to drink. You are pregnant, may be pregnant, or are planning to become pregnant. If you drink alcohol: Limit how much you have to: 0-1 drink a day for women. 0-2 drinks a day for men. Know how much alcohol is in your drink. In the [place] one drink equals one 12 oz bottle of beer (355 mL), one 5 oz glass of wine (148 mL), or one 1 oz glass of hard liquor (44 mL). Activity Get regular exercise. Start an exercise and strength training program as told by your health care provider. Ask your health care provider what activities are safe for you. Your health care provider may recommend: 30 minutes of aerobic activity 4-6 days a week. Brisk walking is an example of aerobic activity. Strength training 2 days a week. General instructions Do not use any products that contain nicotine or tobacco. These products include cigarettes, chewing tobacco, and vaping devices, such as e-cigarettes. If you need help quitting, ask your health care provider. Take over-the-counter and prescription medicines only as told by your health care provider. This includes supplements. Keep all follow-up visits. This is important. Contact a health care provider if: You are having trouble sticking to your exercise or diet plan. You are struggling to quit smoking or to control your use of  alcohol. Summary Dyslipidemia often involves a high level of cholesterol or triglycerides, which are types of lipids. Treatment depends on the type of dyslipidemia that you have and your other risk factors for heart disease and stroke. Treatment for dyslipidemia starts with lifestyle changes, such as diet and exercise. Your health care provider may prescribe medicine to lower lipids. This information is not intended to replace advice given to you by your health care provider. Make sure you discuss any questions you have with your health care provider. Document Revised: 12/04/2021 Document Reviewed: 07/07/2020 Elsevier Patient Education  [employer].  The above assessment and management plan was discussed with the patient. The patient verbalized understanding of and has agreed to the management plan. Patient is aware to call the clinic if they develop any new symptoms or if symptoms persist or worsen. Patient is aware when to return to the clinic for a follow-up visit. Patient educated on when it is appropriate to go to the emergency department.   Neithan Day [place] Thompson, DNP [facility] [address] [place], [place] [address] [phone]       [1] No Known Allergies  "

## 2024-06-07 ENCOUNTER — Telehealth: Payer: Self-pay

## 2024-06-07 LAB — CBC WITH DIFFERENTIAL/PLATELET
Basophils Absolute: 0 x10E3/uL (ref 0.0–0.2)
Basos: 1 %
EOS (ABSOLUTE): 0.1 x10E3/uL (ref 0.0–0.4)
Eos: 2 %
Hematocrit: 44.9 % (ref 37.5–51.0)
Hemoglobin: 15.6 g/dL (ref 13.0–17.7)
Immature Grans (Abs): 0 x10E3/uL (ref 0.0–0.1)
Immature Granulocytes: 0 %
Lymphocytes Absolute: 1.7 x10E3/uL (ref 0.7–3.1)
Lymphs: 35 %
MCH: 31.6 pg (ref 26.6–33.0)
MCHC: 34.7 g/dL (ref 31.5–35.7)
MCV: 91 fL (ref 79–97)
Monocytes Absolute: 0.5 x10E3/uL (ref 0.1–0.9)
Monocytes: 11 %
Neutrophils Absolute: 2.5 x10E3/uL (ref 1.4–7.0)
Neutrophils: 51 %
Platelets: 210 x10E3/uL (ref 150–450)
RBC: 4.94 x10E6/uL (ref 4.14–5.80)
RDW: 12.4 % (ref 11.6–15.4)
WBC: 4.9 x10E3/uL (ref 3.4–10.8)

## 2024-06-07 LAB — CMP14+EGFR
ALT: 41 IU/L (ref 0–44)
AST: 29 IU/L (ref 0–40)
Albumin: 4.6 g/dL (ref 3.8–4.9)
Alkaline Phosphatase: 54 IU/L (ref 47–123)
BUN/Creatinine Ratio: 15 (ref 9–20)
BUN: 13 mg/dL (ref 6–24)
Bilirubin Total: 1.2 mg/dL (ref 0.0–1.2)
CO2: 24 mmol/L (ref 20–29)
Calcium: 9.5 mg/dL (ref 8.7–10.2)
Chloride: 95 mmol/L — AB (ref 96–106)
Creatinine, Ser: 0.86 mg/dL (ref 0.76–1.27)
Globulin, Total: 2.8 g/dL (ref 1.5–4.5)
Glucose: 91 mg/dL (ref 70–99)
Potassium: 3.7 mmol/L (ref 3.5–5.2)
Sodium: 136 mmol/L (ref 134–144)
Total Protein: 7.4 g/dL (ref 6.0–8.5)
eGFR: 102 mL/min/1.73

## 2024-06-07 LAB — MICROALBUMIN / CREATININE URINE RATIO
Creatinine, Urine: 68.5 mg/dL
Microalb/Creat Ratio: 6 mg/g{creat} (ref 0–29)
Microalbumin, Urine: 3.8 ug/mL

## 2024-06-07 LAB — THYROID PANEL WITH TSH
Free Thyroxine Index: 2.2 (ref 1.2–4.9)
T3 Uptake Ratio: 30 % (ref 24–39)
T4, Total: 7.3 ug/dL (ref 4.5–12.0)
TSH: 1.2 u[IU]/mL (ref 0.450–4.500)

## 2024-06-07 LAB — PSA, TOTAL AND FREE
PSA, Free Pct: 26.3 %
PSA, Free: 0.21 ng/mL
Prostate Specific Ag, Serum: 0.8 ng/mL (ref 0.0–4.0)

## 2024-06-07 NOTE — Telephone Encounter (Signed)
 Patient left voicemail inquiring about refill for Tremfya . Left voicemail for patient to call back and schedule an appointment.

## 2024-06-18 ENCOUNTER — Ambulatory Visit

## 2024-06-22 ENCOUNTER — Ambulatory Visit (INDEPENDENT_AMBULATORY_CARE_PROVIDER_SITE_OTHER)

## 2024-06-22 NOTE — Progress Notes (Signed)
 Patient is in office today for a blood pressure check, patient blood pressure reading was 146/78. Waited 4 minutes, repeat blood pressure reading was 155/82. Patient is not symptomatic at this time. Patient states that he takes his medication as prescribed and that he checks his blood pressure at home and most of the time his readings are 110-135/60-80. Patient is asking that recent lab work be reviewed by a provider at this time as it was collected on 06/06/2024.

## 2024-07-03 ENCOUNTER — Ambulatory Visit

## 2024-09-04 ENCOUNTER — Encounter: Admitting: Family Medicine
